# Patient Record
Sex: Female | Born: 1940 | Race: White | Hispanic: No | Marital: Married | Smoking: Never smoker
Health system: Southern US, Community
[De-identification: ages and names within clinical notes are randomized; demographics above are authoritative.]

## PROBLEM LIST (undated history)

## (undated) DIAGNOSIS — E039 Hypothyroidism, unspecified: Secondary | ICD-10-CM

## (undated) DIAGNOSIS — I10 Essential (primary) hypertension: Secondary | ICD-10-CM

## (undated) DIAGNOSIS — M1990 Unspecified osteoarthritis, unspecified site: Secondary | ICD-10-CM

## (undated) HISTORY — PX: COLONOSCOPY: SHX174

## (undated) HISTORY — PX: ANTERIOR AND POSTERIOR VAGINAL REPAIR: SUR5

## (undated) HISTORY — PX: APPENDECTOMY: SHX54

## (undated) HISTORY — PX: ROTATOR CUFF REPAIR: SHX139

## (undated) HISTORY — PX: ABDOMINAL HYSTERECTOMY: SHX81

---

## 2004-12-17 ENCOUNTER — Ambulatory Visit: Payer: Self-pay | Admitting: Internal Medicine

## 2006-03-21 ENCOUNTER — Ambulatory Visit: Payer: Self-pay | Admitting: Internal Medicine

## 2006-05-12 ENCOUNTER — Ambulatory Visit: Payer: Self-pay | Admitting: Unknown Physician Specialty

## 2006-08-03 ENCOUNTER — Ambulatory Visit: Payer: Self-pay | Admitting: Internal Medicine

## 2007-03-27 ENCOUNTER — Ambulatory Visit: Payer: Self-pay | Admitting: Internal Medicine

## 2008-04-17 ENCOUNTER — Ambulatory Visit: Payer: Self-pay | Admitting: Internal Medicine

## 2008-05-22 ENCOUNTER — Ambulatory Visit: Payer: Self-pay | Admitting: Obstetrics and Gynecology

## 2008-05-22 ENCOUNTER — Ambulatory Visit: Payer: Self-pay | Admitting: Cardiovascular Disease

## 2008-05-22 ENCOUNTER — Other Ambulatory Visit: Payer: Self-pay

## 2008-06-09 ENCOUNTER — Ambulatory Visit: Payer: Self-pay | Admitting: Obstetrics and Gynecology

## 2009-04-22 ENCOUNTER — Ambulatory Visit: Payer: Self-pay | Admitting: Internal Medicine

## 2010-04-28 ENCOUNTER — Ambulatory Visit: Payer: Self-pay | Admitting: Internal Medicine

## 2011-06-14 ENCOUNTER — Ambulatory Visit: Payer: Self-pay | Admitting: Internal Medicine

## 2011-06-15 ENCOUNTER — Ambulatory Visit: Payer: Self-pay | Admitting: Internal Medicine

## 2012-06-22 ENCOUNTER — Ambulatory Visit: Payer: Self-pay | Admitting: Internal Medicine

## 2013-06-25 ENCOUNTER — Ambulatory Visit: Payer: Self-pay | Admitting: Internal Medicine

## 2014-05-04 DIAGNOSIS — E039 Hypothyroidism, unspecified: Secondary | ICD-10-CM | POA: Insufficient documentation

## 2014-05-04 DIAGNOSIS — I129 Hypertensive chronic kidney disease with stage 1 through stage 4 chronic kidney disease, or unspecified chronic kidney disease: Secondary | ICD-10-CM | POA: Insufficient documentation

## 2014-05-04 DIAGNOSIS — E785 Hyperlipidemia, unspecified: Secondary | ICD-10-CM | POA: Insufficient documentation

## 2014-07-08 ENCOUNTER — Ambulatory Visit: Payer: Self-pay | Admitting: Internal Medicine

## 2015-05-22 DIAGNOSIS — Z0000 Encounter for general adult medical examination without abnormal findings: Secondary | ICD-10-CM | POA: Insufficient documentation

## 2016-10-10 ENCOUNTER — Other Ambulatory Visit: Payer: Self-pay | Admitting: Internal Medicine

## 2016-10-10 DIAGNOSIS — Z1231 Encounter for screening mammogram for malignant neoplasm of breast: Secondary | ICD-10-CM

## 2016-10-12 ENCOUNTER — Encounter: Payer: Self-pay | Admitting: Radiology

## 2016-10-12 ENCOUNTER — Ambulatory Visit
Admission: RE | Admit: 2016-10-12 | Discharge: 2016-10-12 | Disposition: A | Discharge status: Home / Self Care | Payer: Medicare Other | Source: Ambulatory Visit | Attending: Internal Medicine | Admitting: Internal Medicine

## 2016-10-12 DIAGNOSIS — Z1231 Encounter for screening mammogram for malignant neoplasm of breast: Secondary | ICD-10-CM | POA: Diagnosis present

## 2017-03-02 ENCOUNTER — Other Ambulatory Visit: Payer: Self-pay | Admitting: Orthopedic Surgery

## 2017-03-02 DIAGNOSIS — M1711 Unilateral primary osteoarthritis, right knee: Secondary | ICD-10-CM

## 2017-03-09 ENCOUNTER — Ambulatory Visit
Admission: RE | Admit: 2017-03-09 | Discharge: 2017-03-09 | Disposition: A | Discharge status: Home / Self Care | Payer: Medicare Other | Source: Ambulatory Visit | Attending: Orthopedic Surgery | Admitting: Orthopedic Surgery

## 2017-03-09 DIAGNOSIS — M1711 Unilateral primary osteoarthritis, right knee: Secondary | ICD-10-CM | POA: Diagnosis not present

## 2017-03-22 ENCOUNTER — Encounter
Admission: RE | Admit: 2017-03-22 | Discharge: 2017-03-22 | Disposition: A | Discharge status: Home / Self Care | Payer: Medicare Other | Source: Ambulatory Visit | Attending: Orthopedic Surgery | Admitting: Orthopedic Surgery

## 2017-03-22 ENCOUNTER — Other Ambulatory Visit: Payer: Medicare Other

## 2017-03-22 DIAGNOSIS — Z0183 Encounter for blood typing: Secondary | ICD-10-CM | POA: Insufficient documentation

## 2017-03-22 DIAGNOSIS — Z01812 Encounter for preprocedural laboratory examination: Secondary | ICD-10-CM | POA: Diagnosis not present

## 2017-03-22 DIAGNOSIS — R9431 Abnormal electrocardiogram [ECG] [EKG]: Secondary | ICD-10-CM | POA: Diagnosis not present

## 2017-03-22 DIAGNOSIS — M1711 Unilateral primary osteoarthritis, right knee: Secondary | ICD-10-CM | POA: Insufficient documentation

## 2017-03-22 DIAGNOSIS — Z01818 Encounter for other preprocedural examination: Secondary | ICD-10-CM | POA: Diagnosis not present

## 2017-03-22 HISTORY — DX: Essential (primary) hypertension: I10

## 2017-03-22 HISTORY — DX: Unspecified osteoarthritis, unspecified site: M19.90

## 2017-03-22 HISTORY — DX: Hypothyroidism, unspecified: E03.9

## 2017-03-22 LAB — CBC
HEMATOCRIT: 38.5 % (ref 35.0–47.0)
HEMOGLOBIN: 13.1 g/dL (ref 12.0–16.0)
MCH: 29.6 pg (ref 26.0–34.0)
MCHC: 34.1 g/dL (ref 32.0–36.0)
MCV: 86.7 fL (ref 80.0–100.0)
Platelets: 265 10*3/uL (ref 150–440)
RBC: 4.44 MIL/uL (ref 3.80–5.20)
RDW: 13.4 % (ref 11.5–14.5)
WBC: 5.7 10*3/uL (ref 3.6–11.0)

## 2017-03-22 LAB — TYPE AND SCREEN
ABO/RH(D): A POS
Antibody Screen: NEGATIVE

## 2017-03-22 LAB — BASIC METABOLIC PANEL
ANION GAP: 9 (ref 5–15)
BUN: 25 mg/dL — ABNORMAL HIGH (ref 6–20)
CHLORIDE: 102 mmol/L (ref 101–111)
CO2: 31 mmol/L (ref 22–32)
Calcium: 9.3 mg/dL (ref 8.9–10.3)
Creatinine, Ser: 1.06 mg/dL — ABNORMAL HIGH (ref 0.44–1.00)
GFR calc non Af Amer: 50 mL/min — ABNORMAL LOW (ref >60)
GFR, EST AFRICAN AMERICAN: 58 mL/min — AB (ref >60)
Glucose, Bld: 98 mg/dL (ref 65–99)
POTASSIUM: 3.1 mmol/L — AB (ref 3.5–5.1)
Sodium: 142 mmol/L (ref 135–145)

## 2017-03-22 LAB — PROTIME-INR
INR: 0.94
Prothrombin Time: 12.6 seconds (ref 11.4–15.2)

## 2017-03-22 LAB — SEDIMENTATION RATE: Sed Rate: 21 mm/hr (ref 0–30)

## 2017-03-22 LAB — APTT: APTT: 29 s (ref 24–36)

## 2017-03-22 NOTE — Patient Instructions (Signed)
Your procedure is scheduled on: Tuesday 04/04/17 Report to DAY SURGERY. 2ND FLOOR MEDICAL MALL ENTRANCE. To find out your arrival time please call [phone] between 1PM - 3PM on Monday 04/06/17.  Remember: Instructions that are not followed completely may result in serious medical risk, up to and including death, or upon the discretion of your surgeon and anesthesiologist your surgery may need to be rescheduled.    __X__ 1. Do not eat food or drink liquids after midnight. No gum chewing or hard candies.     __X__ 2. No Alcohol for 24 hours before or after surgery.   ____ 3. Bring all medications with you on the day of surgery if instructed.    __X__ 4. Notify your doctor if there is any change in your medical condition     (cold, fever, infections).             __X___5. No smoking within 24 hours of your surgery.     Do not wear jewelry, make-up, hairpins, clips or nail polish.  Do not wear lotions, powders, or perfumes.   Do not shave 48 hours prior to surgery. Men may shave face and neck.  Do not bring valuables to the hospital.    [facility] is not responsible for any belongings or valuables.               Contacts, dentures or bridgework may not be worn into surgery.  Leave your suitcase in the car. After surgery it may be brought to your room.  For patients admitted to the hospital, discharge time is determined by your                treatment team.   Patients discharged the day of surgery will not be allowed to drive home.   Please read over the following fact sheets that you were given:   MRSA Information   ____ Take these medicines the morning of surgery with A SIP OF WATER:    1. NONE  2.   3.   4.  5.  6.  ____ Fleet Enema (as directed)   __X__ Use CHG Soap as directed  ____ Use inhalers on the day of surgery  ____ Stop metformin 2 days prior to surgery    ____ Take 1/2 of usual insulin dose the night before surgery and none on the morning of surgery.    __X__ Stop Coumadin/Plavix/aspirin on ALREADY STOPPED  __X__ Stop Anti-inflammatories such as Advil, Aleve, Ibuprofen, Motrin, Naproxen, Naprosyn, Goodies,powder, or aspirin products.  OK to take Tylenol.   __X__ Stop supplements until after surgery.  (FISH OIL , CO Q10)  ____ Bring C-Pap to the hospital.

## 2017-03-23 ENCOUNTER — Encounter
Admission: RE | Admit: 2017-03-23 | Discharge: 2017-03-23 | Disposition: A | Discharge status: Home / Self Care | Payer: Medicare Other | Source: Ambulatory Visit | Attending: Orthopedic Surgery | Admitting: Orthopedic Surgery

## 2017-03-23 DIAGNOSIS — Z01818 Encounter for other preprocedural examination: Secondary | ICD-10-CM | POA: Diagnosis not present

## 2017-03-23 LAB — URINALYSIS, ROUTINE W REFLEX MICROSCOPIC
BILIRUBIN URINE: NEGATIVE
Bacteria, UA: NONE SEEN
GLUCOSE, UA: NEGATIVE mg/dL
Ketones, ur: NEGATIVE mg/dL
Nitrite: NEGATIVE
PH: 6 (ref 5.0–8.0)
Protein, ur: NEGATIVE mg/dL
SPECIFIC GRAVITY, URINE: 1.01 (ref 1.005–1.030)

## 2017-03-23 LAB — SURGICAL PCR SCREEN
MRSA, PCR: NEGATIVE
STAPHYLOCOCCUS AUREUS: NEGATIVE

## 2017-03-23 NOTE — Pre-Procedure Instructions (Signed)
[facility] NEEDS KT SUPP STARTED

## 2017-03-24 LAB — URINE CULTURE

## 2017-04-03 MED ORDER — TRANEXAMIC ACID 1000 MG/10ML IV SOLN
1000.0000 mg | INTRAVENOUS | Status: AC
  Administered 2017-04-04: 1000 mg via INTRAVENOUS
  Filled 2017-04-03: qty 10

## 2017-04-03 MED ORDER — CEFAZOLIN SODIUM-DEXTROSE 2-4 GM/100ML-% IV SOLN
2.0000 g | Freq: Once | INTRAVENOUS | Status: AC
  Administered 2017-04-04: 2 g via INTRAVENOUS

## 2017-04-04 ENCOUNTER — Encounter
Admission: RE | Disposition: A | Discharge status: Skilled Nursing Facility | Payer: Self-pay | Source: Ambulatory Visit | Attending: Orthopedic Surgery

## 2017-04-04 ENCOUNTER — Inpatient Hospital Stay: Payer: Medicare Other | Admitting: Registered Nurse

## 2017-04-04 ENCOUNTER — Inpatient Hospital Stay
Admission: RE | Admit: 2017-04-04 | Discharge: 2017-04-07 | DRG: 470 | Disposition: A | Discharge status: Skilled Nursing Facility | Payer: Medicare Other | Source: Ambulatory Visit | Attending: Orthopedic Surgery | Admitting: Orthopedic Surgery

## 2017-04-04 ENCOUNTER — Inpatient Hospital Stay: Payer: Medicare Other

## 2017-04-04 ENCOUNTER — Encounter: Payer: Self-pay | Admitting: *Deleted

## 2017-04-04 DIAGNOSIS — G8918 Other acute postprocedural pain: Secondary | ICD-10-CM

## 2017-04-04 DIAGNOSIS — E876 Hypokalemia: Secondary | ICD-10-CM | POA: Diagnosis not present

## 2017-04-04 DIAGNOSIS — I10 Essential (primary) hypertension: Secondary | ICD-10-CM | POA: Diagnosis present

## 2017-04-04 DIAGNOSIS — M25561 Pain in right knee: Secondary | ICD-10-CM | POA: Diagnosis present

## 2017-04-04 DIAGNOSIS — M1711 Unilateral primary osteoarthritis, right knee: Secondary | ICD-10-CM | POA: Diagnosis present

## 2017-04-04 DIAGNOSIS — E039 Hypothyroidism, unspecified: Secondary | ICD-10-CM | POA: Diagnosis present

## 2017-04-04 DIAGNOSIS — Z79899 Other long term (current) drug therapy: Secondary | ICD-10-CM

## 2017-04-04 HISTORY — PX: TOTAL KNEE ARTHROPLASTY: SHX125

## 2017-04-04 LAB — CBC
HCT: 40.6 % (ref 35.0–47.0)
HEMOGLOBIN: 13.5 g/dL (ref 12.0–16.0)
MCH: 29.4 pg (ref 26.0–34.0)
MCHC: 33.3 g/dL (ref 32.0–36.0)
MCV: 88.2 fL (ref 80.0–100.0)
PLATELETS: 239 10*3/uL (ref 150–440)
RBC: 4.6 MIL/uL (ref 3.80–5.20)
RDW: 14 % (ref 11.5–14.5)
WBC: 5.5 10*3/uL (ref 3.6–11.0)

## 2017-04-04 LAB — CREATININE, SERUM
CREATININE: 1.06 mg/dL — AB (ref 0.44–1.00)
GFR calc Af Amer: 57 mL/min — ABNORMAL LOW (ref >60)
GFR calc non Af Amer: 49 mL/min — ABNORMAL LOW (ref >60)

## 2017-04-04 LAB — POCT I-STAT 4, (NA,K, GLUC, HGB,HCT)
Glucose, Bld: 87 mg/dL (ref 65–99)
HEMATOCRIT: 37 % (ref 36.0–46.0)
HEMOGLOBIN: 12.6 g/dL (ref 12.0–15.0)
Potassium: 3.7 mmol/L (ref 3.5–5.1)
Sodium: 142 mmol/L (ref 135–145)

## 2017-04-04 LAB — ABO/RH: ABO/RH(D): A POS

## 2017-04-04 SURGERY — ARTHROPLASTY, KNEE, TOTAL
Anesthesia: Spinal | Site: Knee | Laterality: Right | Wound class: Clean

## 2017-04-04 MED ORDER — GLYCOPYRROLATE 0.2 MG/ML IJ SOLN
INTRAMUSCULAR | Status: DC | PRN
  Administered 2017-04-04: 0.2 mg via INTRAVENOUS

## 2017-04-04 MED ORDER — BUPIVACAINE LIPOSOME 1.3 % IJ SUSP
INTRAMUSCULAR | Status: AC
  Filled 2017-04-04: qty 20

## 2017-04-04 MED ORDER — CEFAZOLIN SODIUM-DEXTROSE 2-4 GM/100ML-% IV SOLN
2.0000 g | Freq: Four times a day (QID) | INTRAVENOUS | Status: AC
  Administered 2017-04-04 – 2017-04-05 (×2): 2 g via INTRAVENOUS
  Filled 2017-04-04 (×3): qty 100

## 2017-04-04 MED ORDER — PROPOFOL 10 MG/ML IV BOLUS
INTRAVENOUS | Status: AC
  Filled 2017-04-04: qty 20

## 2017-04-04 MED ORDER — MIDAZOLAM HCL 2 MG/2ML IJ SOLN
INTRAMUSCULAR | Status: AC
  Filled 2017-04-04: qty 2

## 2017-04-04 MED ORDER — ONDANSETRON HCL 4 MG PO TABS
4.0000 mg | ORAL_TABLET | Freq: Four times a day (QID) | ORAL | Status: DC | PRN
  Administered 2017-04-05: 4 mg via ORAL
  Filled 2017-04-04: qty 1

## 2017-04-04 MED ORDER — NEOMYCIN-POLYMYXIN B GU 40-200000 IR SOLN
Status: AC
  Filled 2017-04-04: qty 20

## 2017-04-04 MED ORDER — PROPOFOL 500 MG/50ML IV EMUL
INTRAVENOUS | Status: AC
  Filled 2017-04-04: qty 50

## 2017-04-04 MED ORDER — KETAMINE HCL 50 MG/ML IJ SOLN
INTRAMUSCULAR | Status: AC
  Filled 2017-04-04: qty 10

## 2017-04-04 MED ORDER — FENTANYL CITRATE (PF) 100 MCG/2ML IJ SOLN
INTRAMUSCULAR | Status: DC | PRN
  Administered 2017-04-04: 50 ug via INTRAVENOUS
  Administered 2017-04-04 (×2): 25 ug via INTRAVENOUS

## 2017-04-04 MED ORDER — CEFAZOLIN SODIUM-DEXTROSE 2-4 GM/100ML-% IV SOLN
INTRAVENOUS | Status: AC
  Filled 2017-04-04: qty 100

## 2017-04-04 MED ORDER — ACETAMINOPHEN 650 MG RE SUPP: 650.0000 mg | Freq: Four times a day (QID) | RECTAL | Status: DC | PRN

## 2017-04-04 MED ORDER — ACETAMINOPHEN 10 MG/ML IV SOLN
INTRAVENOUS | Status: DC | PRN
  Administered 2017-04-04: 1000 mg via INTRAVENOUS

## 2017-04-04 MED ORDER — SODIUM CHLORIDE 0.9 % IV SOLN: INTRAVENOUS | Status: DC | PRN

## 2017-04-04 MED ORDER — ACETAMINOPHEN 325 MG PO TABS
650.0000 mg | ORAL_TABLET | Freq: Four times a day (QID) | ORAL | Status: DC | PRN
  Administered 2017-04-05: 650 mg via ORAL
  Filled 2017-04-04: qty 2

## 2017-04-04 MED ORDER — ONDANSETRON HCL 4 MG/2ML IJ SOLN: 4.0000 mg | Freq: Once | INTRAMUSCULAR | Status: DC | PRN

## 2017-04-04 MED ORDER — METOCLOPRAMIDE HCL 5 MG/ML IJ SOLN: 5.0000 mg | Freq: Three times a day (TID) | INTRAMUSCULAR | Status: DC | PRN

## 2017-04-04 MED ORDER — METHOCARBAMOL 1000 MG/10ML IJ SOLN
500.0000 mg | Freq: Four times a day (QID) | INTRAVENOUS | Status: DC | PRN
  Filled 2017-04-04: qty 5

## 2017-04-04 MED ORDER — FENTANYL CITRATE (PF) 100 MCG/2ML IJ SOLN
INTRAMUSCULAR | Status: AC
  Filled 2017-04-04: qty 2

## 2017-04-04 MED ORDER — VITAMIN D 1000 UNITS PO TABS
5000.0000 [IU] | ORAL_TABLET | ORAL | Status: DC
  Administered 2017-04-05 – 2017-04-07 (×2): 5000 [IU] via ORAL
  Filled 2017-04-04 (×2): qty 5

## 2017-04-04 MED ORDER — MAGNESIUM OXIDE 400 (241.3 MG) MG PO TABS
200.0000 mg | ORAL_TABLET | Freq: Every evening | ORAL | Status: DC
  Administered 2017-04-04 – 2017-04-06 (×3): 200 mg via ORAL
  Filled 2017-04-04 (×3): qty 1

## 2017-04-04 MED ORDER — OMEGA-3-ACID ETHYL ESTERS 1 G PO CAPS
1000.0000 mg | ORAL_CAPSULE | Freq: Every day | ORAL | Status: DC
  Administered 2017-04-05 – 2017-04-07 (×3): 1000 mg via ORAL
  Filled 2017-04-04 (×3): qty 1

## 2017-04-04 MED ORDER — EPINEPHRINE PF 1 MG/ML IJ SOLN
INTRAMUSCULAR | Status: AC
  Filled 2017-04-04: qty 1

## 2017-04-04 MED ORDER — BISACODYL 10 MG RE SUPP
10.0000 mg | Freq: Every day | RECTAL | Status: DC | PRN
  Administered 2017-04-07: 10 mg via RECTAL
  Filled 2017-04-04: qty 1

## 2017-04-04 MED ORDER — ONDANSETRON HCL 4 MG/2ML IJ SOLN
INTRAMUSCULAR | Status: AC
  Filled 2017-04-04: qty 2

## 2017-04-04 MED ORDER — COQ10 100 MG PO CAPS: 1.0000 | ORAL_CAPSULE | Freq: Two times a day (BID) | ORAL | Status: DC

## 2017-04-04 MED ORDER — FAMOTIDINE 20 MG PO TABS
20.0000 mg | ORAL_TABLET | Freq: Once | ORAL | Status: AC
  Administered 2017-04-04: 20 mg via ORAL

## 2017-04-04 MED ORDER — LIDOCAINE HCL (PF) 2 % IJ SOLN
INTRAMUSCULAR | Status: AC
  Filled 2017-04-04: qty 2

## 2017-04-04 MED ORDER — PROMETHAZINE HCL 25 MG/ML IJ SOLN
12.5000 mg | Freq: Once | INTRAMUSCULAR | Status: AC
  Administered 2017-04-04: 12.5 mg via INTRAVENOUS
  Filled 2017-04-04: qty 1

## 2017-04-04 MED ORDER — SODIUM CHLORIDE 0.9 % IJ SOLN
INTRAMUSCULAR | Status: AC
  Filled 2017-04-04: qty 100

## 2017-04-04 MED ORDER — KETAMINE HCL 10 MG/ML IJ SOLN
INTRAMUSCULAR | Status: DC | PRN
Start: 2017-04-04 — End: 2017-04-04
  Administered 2017-04-04: 25 mg via INTRAVENOUS

## 2017-04-04 MED ORDER — BUPIVACAINE HCL (PF) 0.5 % IJ SOLN
INTRAMUSCULAR | Status: DC | PRN
  Administered 2017-04-04: 3 mL

## 2017-04-04 MED ORDER — MORPHINE SULFATE (PF) 2 MG/ML IV SOLN
2.0000 mg | INTRAVENOUS | Status: DC | PRN
  Administered 2017-04-04: 2 mg via INTRAVENOUS
  Filled 2017-04-04: qty 1

## 2017-04-04 MED ORDER — DIPHENHYDRAMINE HCL 12.5 MG/5ML PO ELIX: 12.5000 mg | ORAL_SOLUTION | ORAL | Status: DC | PRN

## 2017-04-04 MED ORDER — MORPHINE SULFATE 10 MG/ML IJ SOLN
INTRAMUSCULAR | Status: DC | PRN
  Administered 2017-04-04: 10 mg

## 2017-04-04 MED ORDER — SODIUM CHLORIDE 0.9 % IV SOLN
INTRAVENOUS | Status: DC | PRN
  Administered 2017-04-04: 60 mL

## 2017-04-04 MED ORDER — ACETAMINOPHEN 10 MG/ML IV SOLN
INTRAVENOUS | Status: AC
  Filled 2017-04-04: qty 100

## 2017-04-04 MED ORDER — BUPIVACAINE-EPINEPHRINE (PF) 0.25% -1:200000 IJ SOLN
INTRAMUSCULAR | Status: DC | PRN
  Administered 2017-04-04: 30 mL

## 2017-04-04 MED ORDER — SODIUM CHLORIDE 0.9 % IV SOLN
INTRAVENOUS | Status: DC
  Administered 2017-04-04: 14:00:00 via INTRAVENOUS

## 2017-04-04 MED ORDER — LACTATED RINGERS IV SOLN
INTRAVENOUS | Status: DC
  Administered 2017-04-04: 11:00:00 via INTRAVENOUS

## 2017-04-04 MED ORDER — ASPIRIN EC 81 MG PO TBEC
81.0000 mg | DELAYED_RELEASE_TABLET | Freq: Every evening | ORAL | Status: DC
  Administered 2017-04-04 – 2017-04-06 (×3): 81 mg via ORAL
  Filled 2017-04-04 (×3): qty 1

## 2017-04-04 MED ORDER — ALUM & MAG HYDROXIDE-SIMETH 200-200-20 MG/5ML PO SUSP: 30.0000 mL | ORAL | Status: DC | PRN

## 2017-04-04 MED ORDER — PROPOFOL 500 MG/50ML IV EMUL
INTRAVENOUS | Status: DC | PRN
Start: 2017-04-04 — End: 2017-04-04
  Administered 2017-04-04: 100 ug/kg/min via INTRAVENOUS

## 2017-04-04 MED ORDER — CLONIDINE HCL 0.1 MG PO TABS: 0.2000 mg | ORAL_TABLET | Freq: Two times a day (BID) | ORAL | Status: DC | PRN

## 2017-04-04 MED ORDER — ZOLPIDEM TARTRATE 5 MG PO TABS: 5.0000 mg | ORAL_TABLET | Freq: Every evening | ORAL | Status: DC | PRN

## 2017-04-04 MED ORDER — ENOXAPARIN SODIUM 30 MG/0.3ML ~~LOC~~ SOLN
30.0000 mg | Freq: Two times a day (BID) | SUBCUTANEOUS | Status: DC
  Administered 2017-04-05 – 2017-04-07 (×5): 30 mg via SUBCUTANEOUS
  Filled 2017-04-04 (×5): qty 0.3

## 2017-04-04 MED ORDER — LEVOTHYROXINE SODIUM 100 MCG PO TABS
100.0000 ug | ORAL_TABLET | Freq: Every day | ORAL | Status: DC
  Administered 2017-04-05 – 2017-04-07 (×3): 100 ug via ORAL
  Filled 2017-04-04 (×3): qty 1

## 2017-04-04 MED ORDER — METHOCARBAMOL 500 MG PO TABS
500.0000 mg | ORAL_TABLET | Freq: Four times a day (QID) | ORAL | Status: DC | PRN
  Administered 2017-04-05: 500 mg via ORAL
  Filled 2017-04-04: qty 1

## 2017-04-04 MED ORDER — OXYCODONE HCL 5 MG PO TABS
5.0000 mg | ORAL_TABLET | ORAL | Status: DC | PRN
  Administered 2017-04-04: 5 mg via ORAL
  Administered 2017-04-05: 10 mg via ORAL
  Administered 2017-04-05 – 2017-04-07 (×4): 5 mg via ORAL
  Filled 2017-04-04 (×2): qty 1
  Filled 2017-04-04: qty 2
  Filled 2017-04-04 (×3): qty 1

## 2017-04-04 MED ORDER — MIDAZOLAM HCL 5 MG/5ML IJ SOLN
INTRAMUSCULAR | Status: DC | PRN
  Administered 2017-04-04 (×2): 1 mg via INTRAVENOUS

## 2017-04-04 MED ORDER — HYDROCHLOROTHIAZIDE 12.5 MG PO CAPS
12.5000 mg | ORAL_CAPSULE | Freq: Every day | ORAL | Status: DC
  Administered 2017-04-04 – 2017-04-07 (×4): 12.5 mg via ORAL
  Filled 2017-04-04 (×4): qty 1

## 2017-04-04 MED ORDER — ONDANSETRON HCL 4 MG/2ML IJ SOLN
4.0000 mg | Freq: Four times a day (QID) | INTRAMUSCULAR | Status: DC | PRN
  Administered 2017-04-04 – 2017-04-06 (×3): 4 mg via INTRAVENOUS
  Filled 2017-04-04 (×3): qty 2

## 2017-04-04 MED ORDER — MENTHOL 3 MG MT LOZG
1.0000 | LOZENGE | OROMUCOSAL | Status: DC | PRN
  Filled 2017-04-04: qty 9

## 2017-04-04 MED ORDER — NEOMYCIN-POLYMYXIN B GU 40-200000 IR SOLN
Status: DC | PRN
  Administered 2017-04-04: 16 mL

## 2017-04-04 MED ORDER — METOCLOPRAMIDE HCL 10 MG PO TABS: 5.0000 mg | ORAL_TABLET | Freq: Three times a day (TID) | ORAL | Status: DC | PRN

## 2017-04-04 MED ORDER — DOCUSATE SODIUM 100 MG PO CAPS
100.0000 mg | ORAL_CAPSULE | Freq: Two times a day (BID) | ORAL | Status: DC
  Administered 2017-04-04 – 2017-04-07 (×6): 100 mg via ORAL
  Filled 2017-04-04 (×6): qty 1

## 2017-04-04 MED ORDER — MAGNESIUM CITRATE PO SOLN
1.0000 | Freq: Once | ORAL | Status: DC | PRN
  Filled 2017-04-04: qty 296

## 2017-04-04 MED ORDER — BUPIVACAINE HCL (PF) 0.25 % IJ SOLN
INTRAMUSCULAR | Status: AC
  Filled 2017-04-04: qty 30

## 2017-04-04 MED ORDER — FAMOTIDINE 20 MG PO TABS
ORAL_TABLET | ORAL | Status: AC
  Administered 2017-04-04: 20 mg via ORAL
  Filled 2017-04-04: qty 1

## 2017-04-04 MED ORDER — MORPHINE SULFATE (PF) 10 MG/ML IV SOLN
INTRAVENOUS | Status: AC
  Filled 2017-04-04: qty 1

## 2017-04-04 MED ORDER — FENTANYL CITRATE (PF) 100 MCG/2ML IJ SOLN: 25.0000 ug | INTRAMUSCULAR | Status: DC | PRN

## 2017-04-04 MED ORDER — PHENOL 1.4 % MT LIQD
1.0000 | OROMUCOSAL | Status: DC | PRN
  Filled 2017-04-04: qty 177

## 2017-04-04 MED ORDER — MAGNESIUM HYDROXIDE 400 MG/5ML PO SUSP
30.0000 mL | Freq: Every day | ORAL | Status: DC | PRN
Start: 2017-04-04 — End: 2017-04-07
  Administered 2017-04-05 – 2017-04-06 (×2): 30 mL via ORAL
  Filled 2017-04-04 (×3): qty 30

## 2017-04-04 SURGICAL SUPPLY — 66 items
BANDAGE ACE 6X5 VEL STRL LF (GAUZE/BANDAGES/DRESSINGS) ×3 IMPLANT
BLADE SAW 1 (BLADE) ×3 IMPLANT
BLOCK CUTTING FEMUR 4 RT (MISCELLANEOUS) IMPLANT
BLOCK CUTTING TIBIAL 4 RT (MISCELLANEOUS) IMPLANT
BLOCK CUTTING TIBIAL 4 RT MIS (MISCELLANEOUS) IMPLANT
CANISTER SUCT 1200ML W/VALVE (MISCELLANEOUS) ×3 IMPLANT
CANISTER SUCT 3000ML PPV (MISCELLANEOUS) ×6 IMPLANT
CAPT KNEE TOTAL 3 ×3 IMPLANT
CATH FOL LEG HOLDER (MISCELLANEOUS) ×3 IMPLANT
CATH TRAY METER 16FR LF (MISCELLANEOUS) ×3 IMPLANT
CEMENT HV SMART SET (Cement) ×6 IMPLANT
CHLORAPREP W/TINT 26ML (MISCELLANEOUS) ×6 IMPLANT
COOLER POLAR GLACIER W/PUMP (MISCELLANEOUS) ×3 IMPLANT
CUFF TOURN 24 STER (MISCELLANEOUS) IMPLANT
CUFF TOURN 30 STER DUAL PORT (MISCELLANEOUS) ×3 IMPLANT
DRAPE SHEET LG 3/4 BI-LAMINATE (DRAPES) ×6 IMPLANT
ELECT CAUTERY BLADE 6.4 (BLADE) ×3 IMPLANT
ELECT REM PT RETURN 9FT ADLT (ELECTROSURGICAL) ×3
ELECTRODE REM PT RTRN 9FT ADLT (ELECTROSURGICAL) ×1 IMPLANT
FEMUR BONE MODEL IMPLANT
GAUZE PETRO XEROFOAM 1X8 (MISCELLANEOUS) ×3 IMPLANT
GAUZE SPONGE 4X4 12PLY STRL (GAUZE/BANDAGES/DRESSINGS) ×3 IMPLANT
GLOVE BIOGEL PI IND STRL 7.0 (GLOVE) ×6 IMPLANT
GLOVE BIOGEL PI IND STRL 9 (GLOVE) ×1 IMPLANT
GLOVE BIOGEL PI INDICATOR 7.0 (GLOVE) ×12
GLOVE BIOGEL PI INDICATOR 9 (GLOVE) ×2
GLOVE INDICATOR 8.0 STRL GRN (GLOVE) ×6 IMPLANT
GLOVE SURG ORTHO 8.0 STRL STRW (GLOVE) ×6 IMPLANT
GLOVE SURG SYN 9.0  PF PI (GLOVE) ×4
GLOVE SURG SYN 9.0 PF PI (GLOVE) ×2 IMPLANT
GOWN SRG 2XL LVL 4 RGLN SLV (GOWNS) ×1 IMPLANT
GOWN STRL NON-REIN 2XL LVL4 (GOWNS) ×2
GOWN STRL REUS W/ TWL LRG LVL3 (GOWN DISPOSABLE) ×2 IMPLANT
GOWN STRL REUS W/ TWL XL LVL3 (GOWN DISPOSABLE) ×1 IMPLANT
GOWN STRL REUS W/TWL LRG LVL3 (GOWN DISPOSABLE) ×4
GOWN STRL REUS W/TWL XL LVL3 (GOWN DISPOSABLE) ×2
HOOD PEEL AWAY FLYTE STAYCOOL (MISCELLANEOUS) ×6 IMPLANT
IMMBOLIZER KNEE 19 BLUE UNIV (SOFTGOODS) ×3 IMPLANT
KIT RM TURNOVER STRD PROC AR (KITS) ×3 IMPLANT
KNEE MEDACTA TIBIAL/FEMORAL BL (Knees) ×3 IMPLANT
KNIFE SCULPS 14X20 (INSTRUMENTS) ×3 IMPLANT
NDL SAFETY 18GX1.5 (NEEDLE) ×3 IMPLANT
NEEDLE FILTER BLUNT 18X 1/2SAF (NEEDLE) ×2
NEEDLE FILTER BLUNT 18X1 1/2 (NEEDLE) ×1 IMPLANT
NEEDLE SPNL 18GX3.5 QUINCKE PK (NEEDLE) ×3 IMPLANT
NEEDLE SPNL 20GX3.5 QUINCKE YW (NEEDLE) ×3 IMPLANT
NS IRRIG 1000ML POUR BTL (IV SOLUTION) ×3 IMPLANT
PACK TOTAL KNEE (MISCELLANEOUS) ×3 IMPLANT
PAD WRAPON POLAR KNEE (MISCELLANEOUS) ×1 IMPLANT
PULSAVAC PLUS IRRIG FAN TIP (DISPOSABLE) ×3
SOL .9 NS 3000ML IRR  AL (IV SOLUTION) ×2
SOL .9 NS 3000ML IRR UROMATIC (IV SOLUTION) ×1 IMPLANT
STAPLER SKIN PROX 35W (STAPLE) ×3 IMPLANT
SUCTION FRAZIER HANDLE 10FR (MISCELLANEOUS) ×2
SUCTION TUBE FRAZIER 10FR DISP (MISCELLANEOUS) ×1 IMPLANT
SUT DVC 2 QUILL PDO  T11 36X36 (SUTURE) ×2
SUT DVC 2 QUILL PDO T11 36X36 (SUTURE) ×1 IMPLANT
SUT V-LOC 90 ABS DVC 3-0 CL (SUTURE) ×3 IMPLANT
SYR 20CC LL (SYRINGE) ×3 IMPLANT
SYR 3ML 18GX1 1/2 (SYRINGE) ×3 IMPLANT
SYR 50ML LL SCALE MARK (SYRINGE) ×6 IMPLANT
SYR TB 1ML 27GX1/2 LL (SYRINGE) ×3 IMPLANT
TIP FAN IRRIG PULSAVAC PLUS (DISPOSABLE) ×1 IMPLANT
TOWEL OR 17X26 4PK STRL BLUE (TOWEL DISPOSABLE) ×3 IMPLANT
TOWER CARTRIDGE SMART MIX (DISPOSABLE) ×3 IMPLANT
WRAPON POLAR PAD KNEE (MISCELLANEOUS) ×3

## 2017-04-04 NOTE — H&P (Signed)
Patient nausea unrelieved by zofran orders received

## 2017-04-04 NOTE — Anesthesia Post-op Follow-up Note (Signed)
Anesthesia QCDR form completed.        

## 2017-04-04 NOTE — Anesthesia Procedure Notes (Signed)
Spinal  Patient location during procedure: OR Staffing Anesthesiologist: Molli Barrows Resident/CRNA: Rolla Plate Performed: resident/CRNA  Preanesthetic Checklist Completed: patient identified, site marked, surgical consent, pre-op evaluation, timeout performed, IV checked, risks and benefits discussed and monitors and equipment checked Spinal Block Patient position: sitting Prep: ChloraPrep and site prepped and draped Patient monitoring: heart rate, continuous pulse ox, blood pressure and cardiac monitor Approach: midline Location: L4-5 Injection technique: single-shot Needle Needle type: Introducer and Pencan  Needle gauge: 24 G Needle length: 9 cm Additional Notes Negative paresthesia. Negative blood return. Positive free-flowing CSF. Expiration date of kit checked and confirmed. Patient tolerated procedure well, without complications.

## 2017-04-04 NOTE — NC FL2 (Signed)
[place] MEDICAID FL2 LEVEL OF CARE SCREENING TOOL     IDENTIFICATION  Patient Name: Julia Bennett Birthdate: 10/02/1940 Sex: female Admission Date (Current Location): 04/04/2017  [place] and [place] Number:  Chiropodist and Address:  [facility], [address], [place], [place] [address]      Provider Number: [id number]  Attending Physician Name and Address:  Kennedy Bucker, MD  Relative Name and Phone Number:       Current Level of Care: Hospital Recommended Level of Care: Skilled Nursing Facility Prior Approval Number:    Date Approved/Denied:   PASRR Number:  [id number])  Discharge Plan: SNF    Current Diagnoses: Patient Active Problem List   Diagnosis Date Noted  . Primary localized osteoarthritis of right knee 04/04/2017    Orientation RESPIRATION BLADDER Height & Weight     Self, Time, Situation, Place  Normal Continent Weight: 160 lb (72.6 kg) Height:  5\' 6"  (167.6 cm)  BEHAVIORAL SYMPTOMS/MOOD NEUROLOGICAL BOWEL NUTRITION STATUS   (none)  (none) Continent Diet (Regular Diet )  AMBULATORY STATUS COMMUNICATION OF NEEDS Skin   Extensive Assist Verbally Surgical wounds (Incision: Right Knee )                       Personal Care Assistance Level of Assistance  Bathing, Feeding, Dressing Bathing Assistance: Limited assistance Feeding assistance: Independent Dressing Assistance: Limited assistance     Functional Limitations Info  Sight, Hearing, Speech Sight Info: Adequate Hearing Info: Adequate Speech Info: Adequate    SPECIAL CARE FACTORS FREQUENCY  PT (By licensed PT), OT (By licensed OT)     PT Frequency:  (5) OT Frequency:  (5)            Contractures      Additional Factors Info  Code Status, Allergies Code Status Info:  (Full Code. ) Allergies Info:  (No Known Allergies. )           Current Medications (04/04/2017):  This is the current hospital active medication list Current  Facility-Administered Medications  Medication Dose Route Frequency Provider Last Rate Last Dose  . 0.9 %  sodium chloride infusion   Intravenous Continuous Kennedy Bucker, MD 75 mL/hr at 04/04/17 1428    . acetaminophen (TYLENOL) tablet 650 mg  650 mg Oral Q6H PRN Kennedy Bucker, MD       Or  . acetaminophen (TYLENOL) suppository 650 mg  650 mg Rectal Q6H PRN Kennedy Bucker, MD      . alum & mag hydroxide-simeth (MAALOX/MYLANTA) 200-200-20 MG/5ML suspension 30 mL  30 mL Oral Q4H PRN Kennedy Bucker, MD      . aspirin EC tablet 81 mg  81 mg Oral QPM Kennedy Bucker, MD      . bisacodyl (DULCOLAX) suppository 10 mg  10 mg Rectal Daily PRN Kennedy Bucker, MD      . ceFAZolin (ANCEF) IVPB 2g/100 mL premix  2 g Intravenous Q6H Kennedy Bucker, MD      . Melene Muller ON 04/05/2017] cholecalciferol (VITAMIN D) tablet 5,000 Units  5,000 Units Oral Jannifer Hick, MD      . diphenhydrAMINE (BENADRYL) 12.5 MG/5ML elixir 12.5-25 mg  12.5-25 mg Oral Q4H PRN Kennedy Bucker, MD      . docusate sodium (COLACE) capsule 100 mg  100 mg Oral BID Kennedy Bucker, MD      . Melene Muller ON 04/05/2017] enoxaparin (LOVENOX) injection 30 mg  30 mg Subcutaneous Q12H Kennedy Bucker, MD      .  hydrochlorothiazide (MICROZIDE) capsule 12.5 mg  12.5 mg Oral Daily Kennedy BuckerMenz, Michael, MD      . levothyroxine (SYNTHROID, LEVOTHROID) tablet 100 mcg  100 mcg Oral QAC breakfast Kennedy BuckerMenz, Michael, MD      . magnesium citrate solution 1 Bottle  1 Bottle Oral Once PRN Kennedy BuckerMenz, Michael, MD      . magnesium hydroxide (MILK OF MAGNESIA) suspension 30 mL  30 mL Oral Daily PRN Kennedy BuckerMenz, Michael, MD      . magnesium oxide (MAG-OX) tablet 200 mg  200 mg Oral QPM Kennedy BuckerMenz, Michael, MD      . menthol-cetylpyridinium (CEPACOL) lozenge 3 mg  1 lozenge Oral PRN Kennedy BuckerMenz, Michael, MD       Or  . phenol (CHLORASEPTIC) mouth spray 1 spray  1 spray Mouth/Throat PRN Kennedy BuckerMenz, Michael, MD      . methocarbamol (ROBAXIN) tablet 500 mg  500 mg Oral Q6H PRN Kennedy BuckerMenz, Michael, MD       Or  . methocarbamol  (ROBAXIN) 500 mg in dextrose 5 % 50 mL IVPB  500 mg Intravenous Q6H PRN Kennedy BuckerMenz, Michael, MD      . metoCLOPramide (REGLAN) tablet 5-10 mg  5-10 mg Oral Q8H PRN Kennedy BuckerMenz, Michael, MD       Or  . metoCLOPramide (REGLAN) injection 5-10 mg  5-10 mg Intravenous Q8H PRN Kennedy BuckerMenz, Michael, MD      . morphine 2 MG/ML injection 2 mg  2 mg Intravenous Q2H PRN Kennedy BuckerMenz, Michael, MD      . omega-3 acid ethyl esters (LOVAZA) capsule 1,000 mg  1,000 mg Oral Daily Kennedy BuckerMenz, Michael, MD      . ondansetron [facility](ZOFRAN) tablet 4 mg  4 mg Oral Q6H PRN Kennedy BuckerMenz, Michael, MD       Or  . ondansetron [facility](ZOFRAN) injection 4 mg  4 mg Intravenous Q6H PRN Kennedy BuckerMenz, Michael, MD      . oxyCODONE (Oxy IR/ROXICODONE) immediate release tablet 5-10 mg  5-10 mg Oral Q3H PRN Kennedy BuckerMenz, Michael, MD      . zolpidem (AMBIEN) tablet 5 mg  5 mg Oral QHS PRN Kennedy BuckerMenz, Michael, MD         Discharge Medications: Please see discharge summary for a list of discharge medications.  Relevant Imaging Results:  Relevant Lab Results:   Additional Information  (SSN: [id number])  Unique Sillas, Darleen CrockerBailey M, LCSW

## 2017-04-04 NOTE — Progress Notes (Signed)
Patient a&o, vss. Pain meds given. Dressing dry and intact. Polar care and foot pumps in place, towels under ankles. Family at bedside. No nausea/vomiting. Tolerating liquids. Continue to monitor.

## 2017-04-04 NOTE — Anesthesia Preprocedure Evaluation (Signed)
Anesthesia Evaluation  Patient identified by MRN, date of birth, ID band Patient awake    Reviewed: Allergy & Precautions, H&P , NPO status , Patient's Chart, lab work & pertinent test results, reviewed documented beta blocker date and time   Airway Mallampati: II   Neck ROM: full    Dental  (+) Poor Dentition   Pulmonary neg pulmonary ROS,    Pulmonary exam normal        Cardiovascular hypertension, negative cardio ROS Normal cardiovascular exam Rhythm:regular Rate:Normal     Neuro/Psych negative neurological ROS  negative psych ROS   GI/Hepatic negative GI ROS, Neg liver ROS,   Endo/Other  negative endocrine ROS  Renal/GU negative Renal ROS  negative genitourinary   Musculoskeletal   Abdominal   Peds  Hematology negative hematology ROS (+)   Anesthesia Other Findings Past Medical History: No date: Arthritis No date: Hypertension No date: Hypothyroidism Past Surgical History: No date: ABDOMINAL HYSTERECTOMY No date: APPENDECTOMY No date: COLONOSCOPY No date: ROTATOR CUFF REPAIR; Right BMI    Body Mass Index:  25.82 kg/m     Reproductive/Obstetrics negative OB ROS                             Anesthesia Physical Anesthesia Plan  ASA: III  Anesthesia Plan: General   Post-op Pain Management:    Induction:   PONV Risk Score and Plan: 4 or greater and Ondansetron, Dexamethasone, Midazolam, Scopolamine patch - Pre-op and Propofol infusion  Airway Management Planned:   Additional Equipment:   Intra-op Plan:   Post-operative Plan:   Informed Consent: I have reviewed the patients History and Physical, chart, labs and discussed the procedure including the risks, benefits and alternatives for the proposed anesthesia with the patient or authorized representative who has indicated his/her understanding and acceptance.   Dental Advisory Given  Plan Discussed with:  CRNA  Anesthesia Plan Comments:         Anesthesia Quick Evaluation

## 2017-04-04 NOTE — Progress Notes (Signed)

## 2017-04-04 NOTE — H&P (Signed)
Reviewed paper H+P, will be scanned into chart. No changes noted.  

## 2017-04-04 NOTE — Transfer of Care (Signed)
Immediate Anesthesia Transfer of Care Note  Patient: Julia Bennett  Procedure(s) Performed: Procedure(s): TOTAL KNEE ARTHROPLASTY (Right)  Patient Location: PACU  Anesthesia Type:Spinal  Level of Consciousness: sedated  Airway & Oxygen Therapy: Patient Spontanous Breathing  Post-op Assessment: Report given to RN and Post -op Vital signs reviewed and stable  Post vital signs: Reviewed and stable  Last Vitals:  Vitals:   04/04/17 1027 04/04/17 1345  BP: (!) 158/74 (!) 116/52  Pulse: 66 73  Resp: 18 (!) 7  Temp: (!) 36 C (!) 36.3 C    Last Pain:  Vitals:   04/04/17 1027  TempSrc: Tympanic         Complications: No apparent anesthesia complications

## 2017-04-04 NOTE — Op Note (Signed)
04/04/2017  1:44 PM  PATIENT:  Julia Bennett  76 y.o. female  PRE-OPERATIVE DIAGNOSIS:  PRIMARY OSTEOARTHRITIS OF RIGHT KNEE  POST-OPERATIVE DIAGNOSIS:  PRIMARY OSTEOARTHRITIS OF RIGHT KNEE  PROCEDURE:  Procedure(s): TOTAL KNEE ARTHROPLASTY (Right)  SURGEON: Leitha SchullerMichael J Shanetra Blumenstock, MD  ASSISTANTS: [facility]  ANESTHESIA:   spinal  EBL:  Total I/O In: 800 [I.V.:800] Out: 260 [Urine:160; Blood:100]  BLOOD ADMINISTERED:none  DRAINS: none   LOCAL MEDICATIONS USED:  MARCAINE    and OTHER morphine and Exparel  SPECIMEN:  No Specimen  DISPOSITION OF SPECIMEN:  PATHOLOGY  COUNTS:  YES  TOURNIQUET:  58 min at 300 mm Hg  IMPLANTS: Medacta GMK sphere system right 4 femur, 4 tibia with 10 mm insert to patella and 11 x 30 mm stem on the tibia, all components cemented  DICTATION: .Dragon Dictation  patient brought the operating room and after adequate spinal anesthesia was obtained the right leg was prepped draped in sterile fashion was turned by the upper thigh. After patient identification and timeout procedures were completed, tourniquet was raised and a midline skin incision was made with the knee in flexion followed by medial parapatellar arthrotomy. There is sclerotic bone in the medial compartment with significant wear to the femoral condyle and medial tibial condyle with moderate patellofemoral and lateral compartment changes. Anterior cruciate ligament fat pad were excised. Medacta cutting guide applied after removing cartilage off the anterior aspect of the tibia and proximal tibia cut carried out. Next the distal femoral cut was carried out after applying the my knee cutting guide to it. The 4 cutting block was applied anterior posterior and chamfer cuts made with no notching. Residual PCL removed at this time with excision of the posterior horns of the menisci. The 4 tibia baseplate was placed with appropriate rotation based on the preop templating and pin from the my knee guide  and proximal tibial preparation carried out with drilling and placement of the keel. The 4 femoral trial was placed a 10 mm insert gave excellent stability through range of motion was chosen for the final component. Distal femoral drill holes were made followed by the trochlear groove cut. Trials were removed and the knee held in extension and patella cut using the patellar cutting guide and sized to a size 2 after drilling holes were made. This tourniquetwas let down at this point with the injection given and hemostasis checked with electrocautery. Tourniquet was then raised and the bony surfaces thoroughly irrigated and dried the tibial component was cemented into place first with removal of excess cement followed by placement of the tibial polyethylene component with set screw using torque screwdriver. The distal femoral component was then applied and the knee held in extension with patellar button clamped into place with cement being used. After the cement had set excess cement was removed and the knee thoroughly irrigated with a tourniquet let down. Patella tracked well with no touch technique. The arthrotomy was repaired using a heavy Quill followed by 3-0 v-loc subcutaneously. Skin was closed with staples followed by dressing of Xeroform 4 x 4's web roll Ace wrap and Polar Care  PLAN OF CARE: Admit to inpatient   PATIENT DISPOSITION:  PACU - hemodynamically stable.

## 2017-04-05 LAB — CBC
HEMATOCRIT: 35.3 % (ref 35.0–47.0)
HEMOGLOBIN: 12 g/dL (ref 12.0–16.0)
MCH: 30 pg (ref 26.0–34.0)
MCHC: 33.9 g/dL (ref 32.0–36.0)
MCV: 88.5 fL (ref 80.0–100.0)
Platelets: 194 10*3/uL (ref 150–440)
RBC: 3.99 MIL/uL (ref 3.80–5.20)
RDW: 13.7 % (ref 11.5–14.5)
WBC: 7.1 10*3/uL (ref 3.6–11.0)

## 2017-04-05 LAB — BASIC METABOLIC PANEL
ANION GAP: 7 (ref 5–15)
BUN: 17 mg/dL (ref 6–20)
CALCIUM: 8.5 mg/dL — AB (ref 8.9–10.3)
CHLORIDE: 104 mmol/L (ref 101–111)
CO2: 28 mmol/L (ref 22–32)
Creatinine, Ser: 1 mg/dL (ref 0.44–1.00)
GFR calc non Af Amer: 53 mL/min — ABNORMAL LOW (ref >60)
GLUCOSE: 116 mg/dL — AB (ref 65–99)
Potassium: 3.7 mmol/L (ref 3.5–5.1)
Sodium: 139 mmol/L (ref 135–145)

## 2017-04-05 MED ORDER — TRAMADOL HCL 50 MG PO TABS
50.0000 mg | ORAL_TABLET | Freq: Four times a day (QID) | ORAL | Status: DC | PRN
  Administered 2017-04-05: 50 mg via ORAL
  Administered 2017-04-05 – 2017-04-06 (×2): 100 mg via ORAL
  Filled 2017-04-05 (×2): qty 2
  Filled 2017-04-05: qty 1

## 2017-04-05 NOTE — Clinical Social Work Note (Signed)
Clinical Social Work Assessment  Patient Details  Name: Julia Bennett MRN: [id number] Date of Birth: 09-Aug-1941  Date of referral:  04/05/17               Reason for consult:  Facility Placement                Permission sought to share information with:  Chartered certified accountant granted to share information::  Yes, Verbal Permission Granted  Name::      [name]::   [name]   Relationship::     Contact Information:     Housing/Transportation Living arrangements for the past 2 months:  [name] of Information:  Patient Patient Interpreter Needed:  None Criminal Activity/Legal Involvement Pertinent to Current Situation/Hospitalization:  No - Comment as needed Significant Relationships:  Spouse Lives with:  Spouse Do you feel safe going back to the place where you live?  Yes Need for family participation in patient care:  Yes (Comment)  Care giving concerns:  Patient lives in [place] with her husband [name].    Social Worker assessment / plan:  Holiday representative (CSW) received SNF consult. PT is recommending SNF. CSW met with patient alone at bedside to discuss D/C plan. Patient was alert and oriented X4 and was laying in the bed. CSW introduced self and explained role of CSW department. Patient reported that she lives in the [place] part of [place] near [place] with her husband [name]. CSW explained SNF process. Patient is familiar with process because she came to joint class and had family members in SNFs. Patient requested a private room at [facility]. FL2 complete and faxed out. CSW will continue to follow and assist as needed.   Employment status:  Retired Nurse, adult PT Recommendations:  [place] / Referral to community resources:  [place]  Patient/Family's Response to care:  Patient is agreeable to [employer] in  [place].   Patient/Family's Understanding of and Emotional Response to Diagnosis, Current Treatment, and Prognosis:  Patient was very pleasant and thanked CSW for assistance.   Emotional Assessment Appearance:  Appears stated age Attitude/Demeanor/Rapport:    Affect (typically observed):  Accepting, Adaptable, Pleasant Orientation:  Oriented to Self, Oriented to Place, Oriented to  Time, Oriented to Situation Alcohol / Substance use:  Not Applicable Psych involvement (Current and /or in the community):  No (Comment)  Discharge Needs  Concerns to be addressed:  Discharge Planning Concerns Readmission within the last 30 days:  No Current discharge risk:  Dependent with Mobility Barriers to Discharge:  Continued Medical Work up   [employer], Veronia Beets, LCSW 04/05/2017, 10:58 AM

## 2017-04-05 NOTE — Progress Notes (Signed)
Clinical Child psychotherapistocial Worker (CSW) presented bed offers to patient and her [place]. They chose [facility]. [facility] liaison is aware of above. Patient can D/C to [facility] when medically stable.   [employer] Hughes IncorporatedBailey Edin Skarda, LCSW [phone]

## 2017-04-05 NOTE — Evaluation (Signed)
Physical Therapy Evaluation Patient Details Name: Julia Bennett MRN: [id number] DOB: 05/28/1941 Today's Date: 04/05/2017   History of Present Illness  Pt is a 76 yo F with a diagnosis of primary OA of the R knee and is s/p elective R TKA.  Clinical Impression  Pt presents with deficits in strength, transfers, mobility, gait, balance, R knee ROM, and activity tolerance.  Pt required Min A and extra time and effort with all bed mobility tasks.  Pt required Min A for sit to/from stand transfers from elevated EOB with mod verbal cues for sequencing.  Pt antalgic and unsteady in standing with KI donned to RLE and was only able to take 1-2 very small steps with RW and Min A with mod-max verbal cues for sequencing.  Pt did not feel steady or safe enough to attempt transfer from EOB to recliner.  Pt will benefit from PT services in a SNF setting upon discharge to address above deficits for decreased caregiver assistance and safe return to PLOF.      Follow Up Recommendations SNF    Equipment Recommendations  Rolling walker with 5" wheels (TBD at next venue of care if discharges to SNF)    Recommendations for Other Services       Precautions / Restrictions Precautions Precautions: Fall;Knee Precaution Booklet Issued: Yes (comment) Required Braces or Orthoses: Knee Immobilizer - Right Restrictions Weight Bearing Restrictions: Yes RLE Weight Bearing: Weight bearing as tolerated      Mobility  Bed Mobility Overal bed mobility: Needs Assistance Bed Mobility: Supine to Sit;Sit to Supine     Supine to sit: Min assist Sit to supine: Min assist   General bed mobility comments: Extra time and effort required  Transfers Overall transfer level: Needs assistance Equipment used: Rolling walker (2 wheeled) Transfers: Sit to/from Stand Sit to Stand: Min assist         General transfer comment: Elevated EOB, mod verbal cues for sequencing  Ambulation/Gait Ambulation/Gait assistance: Min  assist Ambulation Distance (Feet): 2 Feet Assistive device: Rolling walker (2 wheeled) Gait Pattern/deviations: Step-to pattern;Antalgic   Gait velocity interpretation: Below normal speed for age/gender General Gait Details: KI donned to RLE with WB activities, pt unable to perform Ind RLE SLR; very antalgic gait on RLE  Stairs            Wheelchair Mobility    Modified Rankin (Stroke Patients Only)       Balance Overall balance assessment: Needs assistance Sitting-balance support: No upper extremity supported;Feet supported Sitting balance-Leahy Scale: Good     Standing balance support: Bilateral upper extremity supported Standing balance-Leahy Scale: Fair                               Pertinent Vitals/Pain Pain Assessment: 0-10 Pain Score: 1  Pain Location: R knee Pain Descriptors / Indicators: Aching;Sore Pain Intervention(s): Premedicated before session;Monitored during session;Limited activity within patient's tolerance    Home Living Family/patient expects to be discharged to:: Private residence Living Arrangements: Spouse/significant other Available Help at Discharge: Family;Available PRN/intermittently (Spouse works FT) Type of Home: House Home Access: Stairs to enter Entrance Stairs-Rails: None Secretary/administrator of Steps: 3 Home Layout: Laundry or work area in basement;Two level;Able to live on main level with bedroom/bathroom Home Equipment: Gilmer Mor - single point      Prior Function Level of Independence: Independent with assistive device(s)         Comments: Mod I with amb  with SPC, Ind with ADLs, no fall history     Hand Dominance   Dominant Hand: Right    Extremity/Trunk Assessment        Lower Extremity Assessment Lower Extremity Assessment: Generalized weakness       Communication   Communication: No difficulties  Cognition Arousal/Alertness: Awake/alert Behavior During Therapy: WFL for tasks  assessed/performed Overall Cognitive Status: Within Functional Limits for tasks assessed                                        General Comments      Exercises Total Joint Exercises Ankle Circles/Pumps: AROM;Both;10 reps;15 reps Quad Sets: Strengthening;Both;10 reps;15 reps Gluteal Sets: Strengthening;Both;10 reps;15 reps Hip ABduction/ADduction: AAROM;Right;10 reps Straight Leg Raises: AAROM;Right;5 reps;10 reps Long Arc Quad: AROM;Right;5 reps;10 reps Knee Flexion: AROM;Right;5 reps;10 reps Goniometric ROM: R knee A/AAROM: flex 60/63 deg, ext -10/-6 deg Marching in Standing: AROM;Both;5 reps Other Exercises Other Exercises: TKA excercise book education/review   Assessment/Plan    PT Assessment Patient needs continued PT services  PT Problem List Decreased strength;Decreased range of motion;Decreased activity tolerance;Decreased balance;Decreased knowledge of use of DME;Decreased mobility       PT Treatment Interventions DME instruction;Gait training;Stair training;Functional mobility training;Neuromuscular re-education;Balance training;Therapeutic exercise;Therapeutic activities;Patient/family education    PT Goals (Current goals can be found in the Care Plan section)  Acute Rehab PT Goals Patient Stated Goal: "To go up steps like a normal person" PT Goal Formulation: With patient Time For Goal Achievement: 04/18/17 Potential to Achieve Goals: Good    Frequency BID   Barriers to discharge Inaccessible home environment;Decreased caregiver support      Co-evaluation               AM-PAC PT "6 Clicks" Daily Activity  Outcome Measure Difficulty turning over in bed (including adjusting bedclothes, sheets and blankets)?: Total Difficulty moving from lying on back to sitting on the side of the bed? : Total Difficulty sitting down on and standing up from a chair with arms (e.g., wheelchair, bedside commode, etc,.)?: Total Help needed moving to and  from a bed to chair (including a wheelchair)?: A Lot Help needed walking in hospital room?: Total Help needed climbing 3-5 steps with a railing? : Total 6 Click Score: 7    End of Session Equipment Utilized During Treatment: Gait belt Activity Tolerance: Patient limited by fatigue;Patient limited by pain Patient left: in bed;with bed alarm set;with SCD's reapplied;with call bell/phone within reach (Polar care donned to RLE, B heels elevated with towel rolls) Nurse Communication: Mobility status PT Visit Diagnosis: Other abnormalities of gait and mobility (R26.89);Muscle weakness (generalized) (M62.81)    Time: [id number] PT Time Calculation (min) (ACUTE ONLY): 49 min   Charges:   PT Evaluation $PT Eval Low Complexity: 1 Low PT Treatments $Therapeutic Exercise: 8-22 mins $Therapeutic Activity: 8-22 mins   PT G Codes:        Elly Modena. Scott Emmerson Taddei PT, DPT 04/05/17, 10:39 AM

## 2017-04-05 NOTE — Clinical Social Work Placement (Signed)
   CLINICAL SOCIAL WORK PLACEMENT  NOTE  Date:  04/05/2017  Patient Details  Name: Julia Bennett MRN: [id number] Date of Birth: 09/30/1940  Clinical Social Work is seeking post-discharge placement for this patient at the Skilled  Nursing Facility level of care (*CSW will initial, date and re-position this form in  chart as items are completed):  Yes   Patient/family provided with [facility] Clinical Social Work Department's list of facilities offering this level of care within the geographic area requested by the patient (or if unable, by the patient's family).  Yes   Patient/family informed of their freedom to choose among providers that offer the needed level of care, that participate in Medicare, Medicaid or managed care program needed by the patient, have an available bed and are willing to accept the patient.  Yes   Patient/family informed of [facility] ownership interest in [facility] and [facility], as well as of the fact that they are under no obligation to receive care at these facilities.  PASRR submitted to EDS on 04/04/17     PASRR number received on 04/04/17     Existing PASRR number confirmed on       FL2 transmitted to all facilities in geographic area requested by pt/family on 04/05/17     FL2 transmitted to all facilities within larger geographic area on       Patient informed that his/her managed care company has contracts with or will negotiate with certain facilities, including the following:            Patient/family informed of bed offers received.  Patient chooses bed at       Physician recommends and patient chooses bed at      Patient to be transferred to   on  .  Patient to be transferred to facility by       Patient family notified on   of transfer.  Name of family member notified:        PHYSICIAN       Additional Comment:    _______________________________________________ Braydee Shimkus, Darleen CrockerBailey M, LCSW 04/05/2017, 10:57 AM

## 2017-04-05 NOTE — Progress Notes (Signed)
   Subjective: 1 Day Post-Op Procedure(s) (LRB): TOTAL KNEE ARTHROPLASTY (Right) Patient reports pain as moderate.   Patient is well, and has had no acute complaints or problems Denies any CP, SOB, ABD pain. We will start therapy today.    Objective: Vital signs in last 24 hours: Temp:  [96.8 F (36 C)-99 F (37.2 C)] 99 F (37.2 C) (08/08 0800) Pulse Rate:  [55-76] 76 (08/08 0800) Resp:  [7-20] 20 (08/08 0800) BP: (116-185)/(52-82) 168/82 (08/08 0804) SpO2:  [94 %-99 %] 98 % (08/08 0800) FiO2 (%):  [21 %] 21 % (08/07 1510) Weight:  [72.6 kg (160 lb)] 72.6 kg (160 lb) (08/07 1027)  Intake/Output from previous day: 08/07 0701 - 08/08 0700 In: 1530 [P.O.:240; I.V.:1190; IV Piggyback:100] Out: 1060 [Urine:960; Blood:100] Intake/Output this shift: No intake/output data recorded.   Recent Labs  04/04/17 1034 04/04/17 1044 04/05/17 0327  HGB 13.5 12.6 12.0    Recent Labs  04/04/17 1034 04/04/17 1044 04/05/17 0327  WBC 5.5  --  7.1  RBC 4.60  --  3.99  HCT 40.6 37.0 35.3  PLT 239  --  194    Recent Labs  04/04/17 1034 04/04/17 1044 04/05/17 0327  NA  --  142 139  K  --  3.7 3.7  CL  --   --  104  CO2  --   --  28  BUN  --   --  17  CREATININE 1.06*  --  1.00  GLUCOSE  --  87 116*  CALCIUM  --   --  8.5*   No results for input(s): LABPT, INR in the last 72 hours.  EXAM General - Patient is Alert, Appropriate and Oriented Extremity - Neurovascular intact Sensation intact distally Intact pulses distally Dorsiflexion/Plantar flexion intact No cellulitis present Compartment soft Dressing - dressing C/D/I and no drainage Motor Function - intact, moving foot and toes well on exam.   Past Medical History:  Diagnosis Date  . Arthritis   . Hypertension   . Hypothyroidism     Assessment/Plan:   1 Day Post-Op Procedure(s) (LRB): TOTAL KNEE ARTHROPLASTY (Right) Active Problems:   Primary localized osteoarthritis of right knee  Estimated body mass  index is 25.82 kg/m as calculated from the following:   Height as of this encounter: 5\' 6"  (1.676 m).   Weight as of this encounter: 72.6 kg (160 lb). Advance diet Up with therapy  Needs BM BP elevated - Asymptomatic. Likely pain related, will add ultram to pain regimen. Continue to monitor vitals Recheck labs in the am CM to assist with discharge   DVT Prophylaxis - Aspirin, Lovenox, Foot Pumps and TED hose Weight-Bearing as tolerated to rigjt leg   T. Cranston Neighborhris Azyria Osmon, PA-C [facility] 04/05/2017, 8:10 AM

## 2017-04-05 NOTE — Anesthesia Postprocedure Evaluation (Signed)
Anesthesia Post Note  Patient: Ernestine McmurrayDorothy S Liska  Procedure(s) Performed: Procedure(s) (LRB): TOTAL KNEE ARTHROPLASTY (Right)  Patient location during evaluation: Nursing Unit Anesthesia Type: Spinal Level of consciousness: awake, awake and alert and oriented Pain management: pain level controlled Vital Signs Assessment: post-procedure vital signs reviewed and stable Respiratory status: spontaneous breathing, nonlabored ventilation and respiratory function stable Cardiovascular status: blood pressure returned to baseline and stable Postop Assessment: no headache and no backache Anesthetic complications: no     Last Vitals:  Vitals:   04/04/17 1640 04/04/17 2305  BP: (!) 178/80 (!) 132/58  Pulse: 61 66  Resp: 18 19  Temp:  36.6 C    Last Pain:  Vitals:   04/05/17 0626  TempSrc:   PainSc: 7                  [employer] CorporationStephanie Ta Fair

## 2017-04-05 NOTE — Evaluation (Signed)
Occupational Therapy Evaluation Patient Details Name: Julia Bennett MRN: [id number] DOB: Jan 10, 1941 Today's Date: 04/05/2017    History of Present Illness Pt. is a 76 y.o. female who was admitted to [facility] for a RTKA secondary to OA of the Knee.   Clinical Impression   Pt. Is an1 y.o. Female who was admitted to [facility] for a RTKR. Pt.'s BP 169/75. Pt. resides at home with her husband, and reports being independent with ADLs, and IADLs. Pt. Presents with weakness, decreased activity tolerance, pain, and limited functional mobility. Pt. Will benefit from skilled OT services for ADL training, A/E training, and pt. Education about DME, and home modification. Pt. Education was provided verbally, and through visual demonstration. Pt. Was able to return demonstration, however required assist. Pt. Reports having two reachers at home with suction cups at the tips. Pt. Would benefit from SNF level of care upon discharge with follow-up OT services.      Follow Up Recommendations  SNF    Equipment Recommendations       Recommendations for Other Services       Precautions / Restrictions Precautions Precautions: Fall;Knee Precaution Booklet Issued: Yes (comment) Required Braces or Orthoses: Knee Immobilizer - Right Restrictions Weight Bearing Restrictions: Yes RLE Weight Bearing: Weight bearing as tolerated             ADL either performed or assessed with clinical judgement   ADL Overall ADL's : Needs assistance/impaired Eating/Feeding: Set up   Grooming: Minimal assistance   Upper Body Bathing: Set up   Lower Body Bathing: Maximal assistance   Upper Body Dressing : Set up   Lower Body Dressing: Maximal assistance               Functional mobility during ADLs: Moderate assistance General ADL Comments: Pt. edcuation was provided about OT services, home routines, A/E use for LE ADLs, and DME.     Vision Baseline Vision/History: No visual deficits Patient Visual Report: No  change from baseline       Perception     Praxis      Pertinent Vitals/Pain Pain Assessment: 0-10 Pain Score: 2  Pain Location: R knee Pain Descriptors / Indicators: Aching Pain Intervention(s): Limited activity within patient's tolerance;Monitored during session     Hand Dominance Right   Extremity/Trunk Assessment Upper Extremity Assessment Upper Extremity Assessment: Overall WFL for tasks assessed   Lower Extremity Assessment Lower Extremity Assessment: Generalized weakness       Communication Communication Communication: No difficulties   Cognition Arousal/Alertness: Awake/alert Behavior During Therapy: WFL for tasks assessed/performed Overall Cognitive Status: Within Functional Limits for tasks assessed                                     General Comments       Exercises   Shoulder Instructions      Home Living Family/patient expects to be discharged to:: Private residence Living Arrangements: Spouse/significant other Available Help at Discharge: Family;Available PRN/intermittently;Available 24 hours/day Type of Home: House Home Access: Stairs to enter [employer] of Steps: 3 Entrance Stairs-Rails: None Home Layout: Laundry or work area in basement;Two level;Able to live on main level with bedroom/bathroom     Bathroom Shower/Tub: Walk-in shower;Tub/shower unit;Door;Curtain (Mostly uses the walk-in shower which has a door.)         Home Equipment: Gilmer Mor - single point          Prior  Functioning/Environment Level of Independence: Independent with assistive device(s)        Comments: Independent with ADLs, IADLs        OT Problem List: Decreased strength;Decreased range of motion;Decreased activity tolerance;Impaired UE functional use;Decreased knowledge of use of DME or AE      OT Treatment/Interventions: Self-care/ADL training;Therapeutic exercise;Patient/family education;Therapeutic activities;DME and/or AE  instruction    OT Goals(Current goals can be found in the care plan section) Acute Rehab OT Goals Patient Stated Goal: To regain independence OT Goal Formulation: With patient Potential to Achieve Goals: Good  OT Frequency: Min 2X/week   Barriers to D/C:            Co-evaluation              AM-PAC PT "6 Clicks" Daily Activity     Outcome Measure Help from another person eating meals?: None Help from another person taking care of personal grooming?: A Little Help from another person toileting, which includes using toliet, bedpan, or urinal?: A Lot Help from another person bathing (including washing, rinsing, drying)?: A Lot Help from another person to put on and taking off regular upper body clothing?: None Help from another person to put on and taking off regular lower body clothing?: A Lot 6 Click Score: 17   End of Session    Activity Tolerance: Patient tolerated treatment well Patient left: in bed;with call bell/phone within reach;with family/visitor present  OT Visit Diagnosis: Muscle weakness (generalized) (M62.81)                Time: 1100-1130 OT Time Calculation (min): 30 min Charges:  OT General Charges $OT Visit: 1 Procedure OT Evaluation $OT Eval Moderate Complexity: 1 Procedure OT Treatments $Self Care/Home Management : 23-37 mins G-Codes: OT G-codes **NOT FOR INPATIENT CLASS** Functional Assessment Tool Used: AM-PAC 6 Clicks Daily Activity;Clinical judgement Functional Limitation: Self care Self Care Current Status ([id number]): At least 60 percent but less than 80 percent impaired, limited or restricted Self Care Goal Status ([id number]): At least 1 percent but less than 20 percent impaired, limited or restricted   Olegario MessierElaine Grantland Want, MS, OTR/L   Olegario MessierElaine Tiffney Haughton, MS, OTR/L 04/05/2017, 11:44 AM

## 2017-04-06 LAB — BASIC METABOLIC PANEL
Anion gap: 8 (ref 5–15)
BUN: 12 mg/dL (ref 6–20)
CALCIUM: 8.7 mg/dL — AB (ref 8.9–10.3)
CO2: 32 mmol/L (ref 22–32)
Chloride: 93 mmol/L — ABNORMAL LOW (ref 101–111)
Creatinine, Ser: 1.02 mg/dL — ABNORMAL HIGH (ref 0.44–1.00)
GFR, EST AFRICAN AMERICAN: 60 mL/min — AB (ref >60)
GFR, EST NON AFRICAN AMERICAN: 52 mL/min — AB (ref >60)
Glucose, Bld: 121 mg/dL — ABNORMAL HIGH (ref 65–99)
Potassium: 2.9 mmol/L — ABNORMAL LOW (ref 3.5–5.1)
SODIUM: 133 mmol/L — AB (ref 135–145)

## 2017-04-06 LAB — CBC
HCT: 34 % — ABNORMAL LOW (ref 35.0–47.0)
HEMOGLOBIN: 11.7 g/dL — AB (ref 12.0–16.0)
MCH: 29.7 pg (ref 26.0–34.0)
MCHC: 34.3 g/dL (ref 32.0–36.0)
MCV: 86.5 fL (ref 80.0–100.0)
PLATELETS: 171 10*3/uL (ref 150–440)
RBC: 3.93 MIL/uL (ref 3.80–5.20)
RDW: 13.5 % (ref 11.5–14.5)
WBC: 10.6 10*3/uL (ref 3.6–11.0)

## 2017-04-06 MED ORDER — TRAMADOL HCL 50 MG PO TABS: 50.0000 mg | ORAL_TABLET | Freq: Four times a day (QID) | ORAL | 0 refills | Status: DC | PRN

## 2017-04-06 MED ORDER — DOCUSATE SODIUM 100 MG PO CAPS: 100.0000 mg | ORAL_CAPSULE | Freq: Two times a day (BID) | ORAL | 0 refills | Status: DC

## 2017-04-06 MED ORDER — BISACODYL 10 MG RE SUPP: 10.0000 mg | Freq: Every day | RECTAL | 0 refills | Status: DC | PRN

## 2017-04-06 MED ORDER — POTASSIUM CHLORIDE 20 MEQ PO PACK
40.0000 meq | PACK | Freq: Two times a day (BID) | ORAL | Status: AC
  Administered 2017-04-06 (×2): 40 meq via ORAL
  Filled 2017-04-06 (×2): qty 2

## 2017-04-06 MED ORDER — ENOXAPARIN SODIUM 40 MG/0.4ML ~~LOC~~ SOLN: 40.0000 mg | SUBCUTANEOUS | 0 refills | Status: DC

## 2017-04-06 MED ORDER — OXYCODONE HCL 5 MG PO TABS: 5.0000 mg | ORAL_TABLET | ORAL | 0 refills | Status: DC | PRN

## 2017-04-06 NOTE — Progress Notes (Signed)
Physical Therapy Treatment Patient Details Name: Julia McmurrayDorothy S Wickersham MRN: [id number] DOB: 02/12/1941 Today's Date: 04/06/2017    History of Present Illness Pt. is a 76 y.o. female who [facility] for [facility] secondary to OA of the Knee.    PT Comments    Pt agreeable to PT; reports 3/10 pain R knee. Initial O2 saturation low (88%) on room air. Instructed in pursed lip breathing as well as incentive spirometer expected use. Improved O2 saturation to 92-93%, which pt maintained throughout session. Requires Min A for bed mobility. Min A for sit to/from stand from multiple surfaces with cues required for safety and use of Right lower extremity. Min guard for ambulation with cues for rolling walker placement and 3 point sequencing as well as encouragement for reciprocal step pattern.  Slow progression of range; encouraged stretching as taught this session and educated on open versus closed knee position for management swelling. Pt received up in chair comfortably and plan to see pt this afternoon for continued progress of range, strength, endurance to improve functional mobility.   Follow Up Recommendations  SNF     Equipment Recommendations  Rolling walker with 5" wheels    Recommendations for Other Services       Precautions / Restrictions Precautions Precautions: Fall;Knee Restrictions Weight Bearing Restrictions: Yes RLE Weight Bearing: Weight bearing as tolerated    Mobility  Bed Mobility Overal bed mobility: Needs Assistance Bed Mobility: Supine to Sit     Supine to sit: Min assist     General bed mobility comments: Assist for LE over edge of bed  Transfers Overall transfer level: Needs assistance Equipment used: Rolling walker (2 wheeled) Transfers: Sit to/from Stand Sit to Stand: Min assist         General transfer comment: cues for equal use of LEs and proper use of hands  Ambulation/Gait Ambulation/Gait assistance: Min guard Ambulation Distance (Feet): 30  Feet (also [facility]) Assistive device: Rolling walker (2 wheeled) Gait Pattern/deviations: Step-to pattern (Partial step thru) Gait velocity: decreased Gait velocity interpretation: <1.8 ft/sec, indicative of risk for recurrent falls General Gait Details: cues for pre gait QS in stand with weight shift. Cues for rw placement and 3 point sequence   Stairs            Wheelchair Mobility    Modified Rankin (Stroke Patients Only)       Balance Overall balance assessment: Needs assistance Sitting-balance support: No upper extremity supported;Feet supported Sitting balance-Leahy Scale: Good     Standing balance support: Bilateral upper extremity supported;During functional activity Standing balance-Leahy Scale: Fair                              Cognition Arousal/Alertness: Awake/alert Behavior During Therapy: WFL for tasks assessed/performed Overall Cognitive Status: Within Functional Limits for tasks assessed                                        Exercises Total Joint Exercises Quad Sets: Strengthening;Both;20 reps (long sit; also 10 in stand prior to gait) Knee Flexion: AROM;Right;10 reps;Seated (3 positions each rep with 10 sec hold each) Goniometric ROM: 5-73 Other Exercises Other Exercises: set up for toileting and Min guard assist for personal hygiene    General Comments        Pertinent Vitals/Pain Pain Assessment: 0-10 Pain  Score: 3  Pain Location: R knee Pain Descriptors / Indicators: Aching Pain Intervention(s): Monitored during session;Repositioned;Ice applied;Premedicated before session    Home Living                      Prior Function            PT Goals (current goals can now be found in the care plan section) Progress towards PT goals: Progressing toward goals    Frequency    BID      PT Plan Current plan remains appropriate    Co-evaluation              AM-PAC PT "6 Clicks"  Daily Activity  Outcome Measure  Difficulty turning over in bed (including adjusting bedclothes, sheets and blankets)?: Total Difficulty moving from lying on back to sitting on the side of the bed? : Total Difficulty sitting down on and standing up from a chair with arms (e.g., wheelchair, bedside commode, etc,.)?: Total Help needed moving to and from a bed to chair (including a wheelchair)?: A Little Help needed walking in hospital room?: A Little Help needed climbing 3-5 steps with a railing? : A Lot 6 Click Score: 11    End of Session Equipment Utilized During Treatment: Gait belt Activity Tolerance: Patient tolerated treatment well Patient left: in chair;with call bell/phone within reach;with chair alarm set;with family/visitor present   PT Visit Diagnosis: Other abnormalities of gait and mobility (R26.89);Muscle weakness (generalized) (M62.81)     Time: 1610-9604 PT Time Calculation (min) (ACUTE ONLY): 41 min  Charges:  $Gait Training: 8-22 mins $Therapeutic Exercise: 8-22 mins $Therapeutic Activity: 8-22 mins                    G Codes:        Scot Dock, PTA 04/06/2017, 1:34 PM

## 2017-04-06 NOTE — Discharge Summary (Signed)
Physician Discharge Summary  Patient ID: Julia Bennett MRN: [id number] DOB/AGE: 1940-11-05 76 y.o.  Admit date: 04/04/2017 Discharge date: 04/07/2017  Admission Diagnoses:  PRIMARY OSTEOARTHRITIS OF RIGHT KNEE   Discharge Diagnoses: Patient Active Problem List   Diagnosis Date Noted  . Primary localized osteoarthritis of right knee 04/04/2017    Past Medical History:  Diagnosis Date  . Arthritis   . Hypertension   . Hypothyroidism      Transfusion: none   Consultants (if any):   Discharged Condition: Improved  Hospital Course: Julia Bennett is an 76 y.o. female who was admitted 04/04/2017 with a diagnosis of  Right knee osteoarthritis  and went to the operating room on 04/04/2017 and underwent the above named procedures. The patient has done well with physical therapy. Her labs have stabilized from a potassium of 2.9-4.1 yesterday.   Surgeries: Procedure(s): TOTAL KNEE ARTHROPLASTY on 04/04/2017 Patient tolerated the surgery well. Taken to PACU where she was stabilized and then transferred to the orthopedic floor.  Started on Lovenox 30 q 12 hrs. Foot pumps applied bilaterally at 80 mm. Heels elevated on bed with rolled towels. No evidence of DVT. Negative Homan. Physical therapy started on day #1 for gait training and transfer. OT started day #1 for ADL and assisted devices.  Patient's foley was d/c on day #1. Patient's IV  was d/c on day #2. On post op day 2, patients potassium was 2.9. Klor kon was added po.  On post op day #3 patient was stable and ready for discharge to SNF.  Implants: Medacta GMK sphere system right 4 femur, 4 tibia with 10 mm insert to patella and 11 x 30 mm stem on the tibia, all components cemented  TED HOSE BLE X 6 weeks, remove at night time   She was given perioperative antibiotics:  Anti-infectives    Start     Dose/Rate Route Frequency Ordered Stop   04/04/17 1800  ceFAZolin (ANCEF) IVPB 2g/100 mL premix     2 g 200 mL/hr over 30  Minutes Intravenous Every 6 hours 04/04/17 1509 04/05/17 0150   04/04/17 1021  ceFAZolin (ANCEF) 2-4 GM/100ML-% IVPB    Comments:  Julia Bennett   : cabinet override      04/04/17 1021 04/04/17 1203   04/04/17 0000  ceFAZolin (ANCEF) IVPB 2g/100 mL premix     2 g 200 mL/hr over 30 Minutes Intravenous  Once 04/03/17 2353 04/04/17 1218    .  She was given sequential compression devices, early ambulation, and lovenox for DVT prophylaxis.  She benefited maximally from the hospital stay and there were no complications.    Recent vital signs:  Vitals:   04/06/17 1044 04/06/17 2319  BP:  132/68  Pulse: 73 89  Resp:  16  Temp:  98.2 F (36.8 C)  SpO2: (!) 88% 94%    Recent laboratory studies:  Lab Results  Component Value Date   HGB 11.7 (L) 04/06/2017   HGB 12.0 04/05/2017   HGB 12.6 04/04/2017   Lab Results  Component Value Date   WBC 10.6 04/06/2017   PLT 171 04/06/2017   Lab Results  Component Value Date   INR 0.94 03/22/2017   Lab Results  Component Value Date   NA 132 (L) 04/07/2017   K 4.1 04/07/2017   CL 93 (L) 04/07/2017   CO2 32 04/07/2017   BUN 13 04/07/2017   CREATININE 1.03 (H) 04/07/2017   GLUCOSE 111 (H) 04/07/2017    Discharge  Medications:   Allergies as of 04/07/2017   No Known Allergies     Medication List    TAKE these medications   aspirin EC 81 MG tablet Take 81 mg by mouth every evening.   bisacodyl 10 MG suppository Commonly known as:  DULCOLAX Place 1 suppository (10 mg total) rectally daily as needed for moderate constipation.   CoQ10 100 MG Caps Take 1 tablet by mouth 2 (two) times daily.   docusate sodium 100 MG capsule Commonly known as:  COLACE Take 1 capsule (100 mg total) by mouth 2 (two) times daily.   enoxaparin 40 MG/0.4ML injection Commonly known as:  LOVENOX Inject 0.4 mLs (40 mg total) into the skin daily.   Fish Oil 600 MG Caps Take 1,200 mg by mouth daily.   hydrochlorothiazide 12.5 MG capsule Commonly  known as:  MICROZIDE Take 12.5 mg by mouth daily.   ibuprofen 200 MG tablet Commonly known as:  ADVIL,MOTRIN Take 200 mg by mouth every 8 (eight) hours as needed for moderate pain.   levothyroxine 100 MCG tablet Commonly known as:  SYNTHROID, LEVOTHROID Take 100 mcg by mouth daily before breakfast.   Magnesium Oxide 250 MG Tabs Take 250 mg by mouth every evening.   oxyCODONE 5 MG immediate release tablet Commonly known as:  Oxy IR/ROXICODONE Take 1-2 tablets (5-10 mg total) by mouth every 3 (three) hours as needed for breakthrough pain.   traMADol 50 MG tablet Commonly known as:  ULTRAM Take 1-2 tablets (50-100 mg total) by mouth every 6 (six) hours as needed for moderate pain.   Vitamin D3 5000 units Caps Take 5,000 Units by mouth every other day.            Durable Medical Equipment        Start     Ordered   04/04/17 1510  DME Walker rolling  Once    Question:  Patient needs a walker to treat with the following condition  Answer:  Status post total knee replacement using cement, right   04/04/17 1509   04/04/17 1510  DME 3 n 1  Once     04/04/17 1509   04/04/17 1510  DME Bedside commode  Once    Question:  Patient needs a bedside commode to treat with the following condition  Answer:  Status post total right knee replacement using cement   04/04/17 1509      Diagnostic Studies: Dg Knee 1-2 Views Right  Result Date: 04/04/2017 CLINICAL DATA:  76 year old female status post total knee replacement. EXAM: RIGHT KNEE - 1-2 VIEW COMPARISON:  Right knee radiograph 02/20/2017. FINDINGS: Two views of the right knee demonstrate postoperative changes of total knee replacement. Both femoral and tibial components of the prosthesis appear properly seated without definite periprosthetic fracture or other immediate complicating features. Small amount of gas in the joint space and in the overlying soft tissues. Anterior skin staples are noted. IMPRESSION: 1. Expected postoperative  findings of right total knee replacement without acute complicating features, as above. Electronically Signed   By: Trudie Reed M.D.   On: 04/04/2017 14:21   Ct Knee Right Wo Contrast  Result Date: 03/09/2017 CLINICAL DATA:  Right knee pain, stiffness and swelling for 2-3 years. MY knee preoperative planning. EXAM: CT OF THE right KNEE WITHOUT CONTRAST TECHNIQUE: Multidetector CT imaging of the right knee was performed according to the standard protocol. Multiplanar CT image reconstructions were also generated. COMPARISON:  None available FINDINGS: Right hip: Axial images demonstrate  mild degenerative changes but no fracture or AVN. The right SI joint appears normal. Right knee: Advanced tricompartmental degenerative changes with joint space narrowing, osteophytic spurring, bony eburnation and subchondral cystic change. 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 No fracture or osteochondral lesion. There is a moderate-sized joint effusion. Right ankle: Axial images do not demonstrate any significant degenerative changes or osteochondral abnormality. IMPRESSION: Advanced tricompartmental degenerative changes involving the right knee as described above. This is most significant in the medial compartment. Electronically Signed   By: Rudie MeyerP.  Gallerani M.D.   On: 03/09/2017 10:08    Disposition: Final discharge disposition not confirmed     Contact information for follow-up providers    Kennedy BuckerMenz, Michael, MD Follow up in 2 week(s).   Specialty:  Orthopedic Surgery Contact information: [address] [facility] [place] [id number] [phone]            Contact information for after-discharge care    Destination    [facility] Follow up.   Specialty:  Skilled Nursing Facility Contact information: [address] [place] [id number] [phone]                   Signed: Lenard ForthMUNDY, Julia Marasigan 04/07/2017, 7:24 AM

## 2017-04-06 NOTE — Progress Notes (Signed)
Occupational Therapy Treatment Patient Details Name: Julia Bennett MRN: [id number] DOB: 1941/04/23 Today's Date: 04/06/2017    History of present illness Pt. is a 76 y.o. female who was admitted to [facility] for a RTKA secondary to OA of the Knee.   OT comments  Pt seen for OT treatment session this date. Pt/spouse educated in use of AE for LB dressing tasks with verbal instruction and visual demonstration. Pt unable to tolerate trialing herself due to 10/10 pain when attempting to lower BLE leg rest on recliner. RN gave pain meds during session and noted pt had not had pain medication since last night. Pt/spouse educated in importance of pain mgt to support therapy participation. PT notified of recent pain medication to coordinate afternoon PT session to maximize pt's ability to participate. Continue to progress towards goals. STR still appropriate.   Follow Up Recommendations  SNF    Equipment Recommendations       Recommendations for Other Services      Precautions / Restrictions Precautions Precautions: Fall;Knee Required Braces or Orthoses: Knee Immobilizer - Right Knee Immobilizer - Right:  (until discontinued) Restrictions Weight Bearing Restrictions: Yes RLE Weight Bearing: Weight bearing as tolerated       Mobility Bed Mobility      General bed mobility comments: deferred, pt up in recliner  Transfers         General transfer comment: deferred due to pain; 10/10 pain in R knee when attempting to lower recliner leg rests.    Balance Overall balance assessment: Needs assistance Sitting-balance support: No upper extremity supported;Feet supported Sitting balance-Leahy Scale: Good                                  ADL either performed or assessed with clinical judgement   ADL Overall ADL's : Needs assistance/impaired                                       General ADL Comments: Pt/spouse educated in use of AE for LB dressing tasks  with verbal instruction and visual demonstration. Pt unable to tolerate trialing herself due to 10/10 pain. RN gave pain meds during session.     Vision Baseline Vision/History: No visual deficits Patient Visual Report: No change from baseline     Perception     Praxis      Cognition Arousal/Alertness: Awake/alert Behavior During Therapy: WFL for tasks assessed/performed Overall Cognitive Status: Within Functional Limits for tasks assessed                                          Exercises   Shoulder Instructions       General Comments      Pertinent Vitals/ Pain       Pain Assessment: 0-10 Pain Score: 10-Worst pain ever Pain Location: R knee with minimal movement Pain Descriptors / Indicators: Aching Pain Intervention(s): Limited activity within patient's tolerance;Monitored during session;Repositioned;Ice applied;Patient requesting pain meds-RN notified;RN gave pain meds during session  Home Living  Prior Functioning/Environment              Frequency  Min 2X/week        Progress Toward Goals  OT Goals(current goals can now be found in the care plan section)  Progress towards OT goals: Not progressing toward goals - comment;OT to reassess next treatment (limited by pain, reassess next session)  Acute Rehab OT Goals Patient Stated Goal: to get stronger OT Goal Formulation: With patient Potential to Achieve Goals: Good  Plan Discharge plan remains appropriate;Frequency remains appropriate    Co-evaluation                 AM-PAC PT "6 Clicks" Daily Activity     Outcome Measure   Help from another person eating meals?: None Help from another person taking care of personal grooming?: A Little Help from another person toileting, which includes using toliet, bedpan, or urinal?: A Lot Help from another person bathing (including washing, rinsing, drying)?: A Lot Help from  another person to put on and taking off regular upper body clothing?: None Help from another person to put on and taking off regular lower body clothing?: A Lot 6 Click Score: 17    End of Session    OT Visit Diagnosis: Muscle weakness (generalized) (M62.81)   Activity Tolerance Patient limited by pain   Patient Left in chair;with call bell/phone within reach;with chair alarm set;with family/visitor present;with SCD's reapplied;Other (comment) (polar care in place)   Nurse Communication Patient requests pain meds    Functional Assessment Tool Used: AM-PAC 6 Clicks Daily Activity;Clinical judgement Functional Limitation: Self care Self Care Current Status (Z6109(G8987): At least 60 percent but less than 80 percent impaired, limited or restricted Self Care Goal Status (U0454(G8988): At least 40 percent but less than 60 percent impaired, limited or restricted   Time: [id number] OT Time Calculation (min): 16 min  Charges: OT G-codes **NOT FOR INPATIENT CLASS** Functional Assessment Tool Used: AM-PAC 6 Clicks Daily Activity;Clinical judgement Functional Limitation: Self care Self Care Current Status (N8295(G8987): At least 60 percent but less than 80 percent impaired, limited or restricted Self Care Goal Status (A2130(G8988): At least 40 percent but less than 60 percent impaired, limited or restricted OT General Charges $OT Visit: 1 Procedure OT Treatments $Self Care/Home Management : 8-22 mins  Richrd PrimeJamie Stiller, MPH, MS, OTR/L ascom [phone] 04/06/17, 2:32 PM

## 2017-04-06 NOTE — Progress Notes (Signed)
Physical Therapy Treatment Patient Details Name: Julia McmurrayDorothy S Musquiz MRN: [id number] DOB: 05/07/1941 Today's Date: 04/06/2017    History of Present Illness Pt. is a 76 y.o. female who [facility] for a RTKA secondary to OA of the Knee.    PT Comments    Late note entry due to down time of electronic health record.  This note reflects PT session on 04/05/2017 from 1438-15:06.   Ms. Ulyses AmorHumble made good progress with mobility but continues to require assist with transfers.  Her ambulatory distance is limited due to fatigue.  SNF remains most appropriate d/c plan.    Follow Up Recommendations  SNF     Equipment Recommendations  Rolling walker with 5" wheels (TBD at next venue of care if discharges to SNF)    Recommendations for Other Services       Precautions / Restrictions Precautions Precautions: Fall;Knee Required Braces or Orthoses: Knee Immobilizer - Right Knee Immobilizer - Right:  (Order says "until discontinued") Restrictions Weight Bearing Restrictions: Yes RLE Weight Bearing: Weight bearing as tolerated    Mobility  Bed Mobility Overal bed mobility: Needs Assistance Bed Mobility: Supine to Sit     Supine to sit: Min guard;HOB elevated     General bed mobility comments: Increased time and effort with use of bed rail.  Transfers Overall transfer level: Needs assistance Equipment used: Rolling walker (2 wheeled) Transfers: Sit to/from Stand Sit to Stand: Min assist         General transfer comment: Min assist to boost to standing with cues for proper hand placement.  Pt with poorly controlled descent to chair when sitting.  Ambulation/Gait Ambulation/Gait assistance: Min guard Ambulation Distance (Feet): 50 Feet Assistive device: Rolling walker (2 wheeled) Gait Pattern/deviations: Step-to pattern;Decreased stride length;Decreased stance time - right;Decreased step length - left;Decreased weight shift to right;Antalgic;Trunk flexed Gait velocity:  decreased Gait velocity interpretation: <1.8 ft/sec, indicative of risk for recurrent falls General Gait Details: Cues for upright posture as pt demonstrates flexed posture (husband reports this is her baseline).  Pt fatigues at end of ambulation.     Stairs            Wheelchair Mobility    Modified Rankin (Stroke Patients Only)       Balance Overall balance assessment: Needs assistance Sitting-balance support: No upper extremity supported;Feet supported Sitting balance-Leahy Scale: Good     Standing balance support: Bilateral upper extremity supported;During functional activity Standing balance-Leahy Scale: Poor Standing balance comment: Relies on UE support for static and dynamic activities                            Cognition Arousal/Alertness: Awake/alert Behavior During Therapy: WFL for tasks assessed/performed Overall Cognitive Status: Within Functional Limits for tasks assessed                                        Exercises Total Joint Exercises Ankle Circles/Pumps: AROM;Both;10 reps;Supine Quad Sets: Strengthening;Both;10 reps;Supine Hip ABduction/ADduction: AAROM;Right;10 reps;Supine Knee Flexion: AAROM;Right;5 reps;Seated;Other (comment) (with 5 second holds) Goniometric ROM: R knee flexion AAROM 77 deg    General Comments        Pertinent Vitals/Pain Pain Assessment: 0-10 Pain Score: 6  Pain Location: R knee Pain Descriptors / Indicators: Aching Pain Intervention(s): Limited activity within patient's tolerance;Monitored during session;Repositioned    Home Living  Prior Function            PT Goals (current goals can now be found in the care plan section) Acute Rehab PT Goals Patient Stated Goal: to get stronger PT Goal Formulation: With patient Time For Goal Achievement: 04/18/17 Potential to Achieve Goals: Good Progress towards PT goals: Progressing toward goals     Frequency    BID      PT Plan Current plan remains appropriate    Co-evaluation              AM-PAC PT "6 Clicks" Daily Activity  Outcome Measure  Difficulty turning over in bed (including adjusting bedclothes, sheets and blankets)?: Total Difficulty moving from lying on back to sitting on the side of the bed? : Total Difficulty sitting down on and standing up from a chair with arms (e.g., wheelchair, bedside commode, etc,.)?: Total Help needed moving to and from a bed to chair (including a wheelchair)?: A Little Help needed walking in hospital room?: A Little Help needed climbing 3-5 steps with a railing? : A Lot 6 Click Score: 11    End of Session Equipment Utilized During Treatment: Gait belt;Right knee immobilizer Activity Tolerance: Patient tolerated treatment well;Patient limited by fatigue Patient left: in chair;with call bell/phone within reach;with chair alarm set;with family/visitor present Nurse Communication: Mobility status PT Visit Diagnosis: Other abnormalities of gait and mobility (R26.89);Muscle weakness (generalized) (M62.81)     Time: 1610-9604 PT Time Calculation (min) (ACUTE ONLY): 28 min  Charges:  $Gait Training: 8-22 mins $Therapeutic Exercise: 8-22 mins                    G Codes:       Encarnacion Chu PT, DPT 04/06/2017, 8:27 AM

## 2017-04-06 NOTE — Progress Notes (Signed)
Pt has been up to chair and worked with PT today and did well. Potassium 2.9 this AM. PA aware and pt is receiving supplement for such per his order with a recheck in the AM. Pt's pain as been well controlled today. Spouse in and supportive today. Vital signs have been stable. Pt given milk of magnesia per MD order today. Plan is to d/c pt to SNF [facility] tomorrow.

## 2017-04-06 NOTE — Discharge Instructions (Signed)

## 2017-04-06 NOTE — Progress Notes (Signed)
Physical Therapy Treatment Patient Details Name: Julia Bennett MRN: [id number] DOB: 1940/09/12 Today's Date: 04/06/2017    History of Present Illness Pt. is a 76 y.o. female who was admitted to [facility] for a RTKA secondary to OA of the Knee.    PT Comments    Pt agreeable to PT; reports increased R knee pain to 8/10 with movement post medication; reports little to no pain at rest. Pt re educated in the importance of movement with moderate or less pain. Pt requires continued cueing for proper foot and hand placement with transfers as well as improved use of Right lower extremity; does show some improvement with subsequent transfers this session although continues to require Min A. Limited ambulation due to pain with weight transfers to Right lower extremity. Pt feels unable to bend R knee in seated position much beyond 60 degrees. Pt returned to supine and educated/practiced supine self assisted R knee flexion in small range. Spouse educated as well. Pt encouraged to practice range every 2 hours. Continue PT to progress endurance, strength, range and balance to improve all functional mobility.   Follow Up Recommendations  SNF     Equipment Recommendations  Rolling walker with 5" wheels (TBD at next venue of care if discharges to SNF)    Recommendations for Other Services       Precautions / Restrictions Precautions Precautions: Fall;Knee Required Braces or Orthoses: Knee Immobilizer - Right Knee Immobilizer - Right:  (Order says "until discontinued") Restrictions Weight Bearing Restrictions: Yes RLE Weight Bearing: Weight bearing as tolerated    Mobility  Bed Mobility Overal bed mobility: Needs Assistance Bed Mobility: Sit to Supine     Supine to sit: Min assist Sit to supine: Min assist   General bed mobility comments: Assist for LEs; use of trapeze to reposition upward in bed  Transfers Overall transfer level: Needs assistance Equipment used: Rolling walker (2  wheeled) Transfers: Sit to/from Stand Sit to Stand: Min assist         General transfer comment: Cues for increased use of RLE and improved foot placment to allow for stand balance and controlled descent. Performed from chair [facility] and bed. Improved with subsequent transfers  Ambulation/Gait Ambulation/Gait assistance: Min guard Ambulation Distance (Feet): 5 Feet Assistive device: Rolling walker (2 wheeled) Gait Pattern/deviations: Step-to pattern (Partial step thru) Gait velocity: decreased Gait velocity interpretation: <1.8 ft/sec, indicative of risk for recurrent falls General Gait Details: Difficulty clearing LLE due to pain in RLE with weight transfer   Stairs            Wheelchair Mobility    Modified Rankin (Stroke Patients Only)       Balance Overall balance assessment: Needs assistance Sitting-balance support: No upper extremity supported;Feet supported Sitting balance-Leahy Scale: Good     Standing balance support: Bilateral upper extremity supported;During functional activity Standing balance-Leahy Scale: Fair                              Cognition Arousal/Alertness: Awake/alert Behavior During Therapy: WFL for tasks assessed/performed Overall Cognitive Status: Within Functional Limits for tasks assessed                                        Exercises Total Joint Exercises Ankle Circles/Pumps: AROM;Both;10 reps Quad Sets: Strengthening;Both;20 reps (long sit; also 10 in stand prior to gait)  Knee Flexion: AAROM;Right;10 reps;Supine (2 sets. self assisted) Goniometric ROM: 5-73 Other Exercises Other Exercises: set up for toileting and Min guard assist for personal hygiene    General Comments        Pertinent Vitals/Pain Pain Assessment: 0-10 Pain Score: 8  Pain Location: R knee Pain Descriptors / Indicators: Aching;Operative site guarding Pain Intervention(s): Limited activity within patient's tolerance;Ice  applied    Home Living                      Prior Function            PT Goals (current goals can now be found in the care plan section) Acute Rehab PT Goals Patient Stated Goal: to get stronger PT Goal Formulation: With patient Time For Goal Achievement: 04/18/17 Potential to Achieve Goals: Good Progress towards PT goals: Progressing toward goals    Frequency    BID      PT Plan Current plan remains appropriate    Co-evaluation              AM-PAC PT "6 Clicks" Daily Activity  Outcome Measure  Difficulty turning over in bed (including adjusting bedclothes, sheets and blankets)?: A Little Difficulty moving from lying on back to sitting on the side of the bed? : Total Difficulty sitting down on and standing up from a chair with arms (e.g., wheelchair, bedside commode, etc,.)?: Total Help needed moving to and from a bed to chair (including a wheelchair)?: A Little Help needed walking in hospital room?: A Little Help needed climbing 3-5 steps with a railing? : A Lot 6 Click Score: 13    End of Session Equipment Utilized During Treatment: Gait belt Activity Tolerance: Patient limited by pain Patient left: in bed;with call bell/phone within reach;with bed alarm set;with family/visitor present Nurse Communication: Mobility status PT Visit Diagnosis: Other abnormalities of gait and mobility (R26.89);Muscle weakness (generalized) (M62.81)     Time: [id number] PT Time Calculation (min) (ACUTE ONLY): 24 min  Charges:  $Gait Training: 8-22 mins $Therapeutic Exercise: 8-22 mins $Therapeutic Activity: 8-22 mins                    G Codes:        Scot DockHeidi E Barnes, PTA 04/06/2017, 4:36 PM

## 2017-04-06 NOTE — Clinical Social Work Note (Signed)
Per PA, pt will likely discharge on Friday, 04/07/2017. CSW updated facility and they will be ready to accept pt when stable for transfer. CSW will continue to follow.   Dede QuerySarah Rashun Grattan, MSW, LCSW  Clinical Social Worker  [phone]

## 2017-04-06 NOTE — Progress Notes (Signed)
   Subjective: 2 Days Post-Op Procedure(s) (LRB): TOTAL KNEE ARTHROPLASTY (Right) Patient reports pain as mild.   Patient is well, and has had no acute complaints or problems Denies any CP, SOB, ABD pain. We will continue therapy today.    Objective: Vital signs in last 24 hours: Temp:  [97.8 F (36.6 C)-99 F (37.2 C)] 97.8 F (36.6 C) (08/08 2357) Pulse Rate:  [76-82] 82 (08/08 2357) Resp:  [16-20] 16 (08/08 2357) BP: (138-170)/(68-86) 138/68 (08/08 2357) SpO2:  [91 %-98 %] 91 % (08/08 2357)  Intake/Output from previous day: 08/08 0701 - 08/09 0700 In: 720 [P.O.:720] Out: -  Intake/Output this shift: No intake/output data recorded.   Recent Labs  04/04/17 1034 04/04/17 1044 04/05/17 0327 04/06/17 0314  HGB 13.5 12.6 12.0 11.7*    Recent Labs  04/05/17 0327 04/06/17 0314  WBC 7.1 10.6  RBC 3.99 3.93  HCT 35.3 34.0*  PLT 194 171    Recent Labs  04/05/17 0327 04/06/17 0314  NA 139 133*  K 3.7 2.9*  CL 104 93*  CO2 28 32  BUN 17 12  CREATININE 1.00 1.02*  GLUCOSE 116* 121*  CALCIUM 8.5* 8.7*   No results for input(s): LABPT, INR in the last 72 hours.  EXAM General - Patient is Alert, Appropriate and Oriented Extremity - Neurovascular intact Sensation intact distally Intact pulses distally Dorsiflexion/Plantar flexion intact No cellulitis present Compartment soft Dressing - dressing C/D/I and no drainage, dressing changed Motor Function - intact, moving foot and toes well on exam.   Past Medical History:  Diagnosis Date  . Arthritis   . Hypertension   . Hypothyroidism     Assessment/Plan:   2 Days Post-Op Procedure(s) (LRB): TOTAL KNEE ARTHROPLASTY (Right) Active Problems:   Primary localized osteoarthritis of right knee   hypokalemia Estimated body mass index is 25.82 kg/m as calculated from the following:   Height as of this encounter: 5\' 6"  (1.676 m).   Weight as of this encounter: 72.6 kg (160 lb). Advance diet Up with  therapy  Needs BM Hypokalemia - Klor kon 40 meq BID, recheck labs in the am CM to assist with discharge, SNF tomorrow   DVT Prophylaxis - Lovenox, Foot Pumps and TED hose Weight-Bearing as tolerated to rigjt leg   T. Cranston Neighborhris Malkie Wille, PA-C [facility] 04/06/2017, 7:04 AM

## 2017-04-07 LAB — BASIC METABOLIC PANEL
Anion gap: 7 (ref 5–15)
BUN: 13 mg/dL (ref 6–20)
CHLORIDE: 93 mmol/L — AB (ref 101–111)
CO2: 32 mmol/L (ref 22–32)
Calcium: 8.7 mg/dL — ABNORMAL LOW (ref 8.9–10.3)
Creatinine, Ser: 1.03 mg/dL — ABNORMAL HIGH (ref 0.44–1.00)
GFR calc Af Amer: 59 mL/min — ABNORMAL LOW (ref >60)
GFR calc non Af Amer: 51 mL/min — ABNORMAL LOW (ref >60)
GLUCOSE: 111 mg/dL — AB (ref 65–99)
POTASSIUM: 4.1 mmol/L (ref 3.5–5.1)
Sodium: 132 mmol/L — ABNORMAL LOW (ref 135–145)

## 2017-04-07 NOTE — Care Management Important Message (Signed)
Important Message  Patient Details  Name: Ernestine McmurrayDorothy S Gershman MRN: [id number] Date of Birth: 08/18/1941   Medicare Important Message Given:  Yes    Marily MemosLisa M Gaspar Fowle, RN 04/07/2017, 8:05 AM

## 2017-04-07 NOTE — Progress Notes (Addendum)
   Subjective: 3 Days Post-Op Procedure(s) (LRB): TOTAL KNEE ARTHROPLASTY (Right) Patient reports pain as mild.   Patient is well, and has had no acute complaints or problems Denies any CP, SOB, ABD pain. We will continue therapy today.    Objective: Vital signs in last 24 hours: Temp:  [97.7 F (36.5 C)-98.2 F (36.8 C)] 98.2 F (36.8 C) (08/09 2319) Pulse Rate:  [73-89] 89 (08/09 2319) Resp:  [16] 16 (08/09 2319) BP: (132-146)/(64-68) 132/68 (08/09 2319) SpO2:  [88 %-94 %] 94 % (08/09 2319)  Intake/Output from previous day: 08/09 0701 - 08/10 0700 In: 360 [P.O.:360] Out: -  Intake/Output this shift: No intake/output data recorded.   Recent Labs  04/04/17 1034 04/04/17 1044 04/05/17 0327 04/06/17 0314  HGB 13.5 12.6 12.0 11.7*    Recent Labs  04/05/17 0327 04/06/17 0314  WBC 7.1 10.6  RBC 3.99 3.93  HCT 35.3 34.0*  PLT 194 171    Recent Labs  04/06/17 0314 04/07/17 0407  NA 133* 132*  K 2.9* 4.1  CL 93* 93*  CO2 32 32  BUN 12 13  CREATININE 1.02* 1.03*  GLUCOSE 121* 111*  CALCIUM 8.7* 8.7*   No results for input(s): LABPT, INR in the last 72 hours.  EXAM General - Patient is Alert, Appropriate and Oriented Extremity - Neurovascular intact Sensation intact distally Intact pulses distally Dorsiflexion/Plantar flexion intact No cellulitis present Compartment soft Dressing - dressing C/D/I and no drainage, dressing changed Motor Function - intact, moving foot and toes well on exam.   Past Medical History:  Diagnosis Date  . Arthritis   . Hypertension   . Hypothyroidism     Assessment/Plan:   3 Days Post-Op Procedure(s) (LRB): TOTAL KNEE ARTHROPLASTY (Right) Active Problems:   Primary localized osteoarthritis of right knee   hypokalemia Estimated body mass index is 25.82 kg/m as calculated from the following:   Height as of this encounter: 5\' 6"  (1.676 m).   Weight as of this encounter: 72.6 kg (160 lb). Advance diet Up with  therapy  Needs BM Hypokalemia -Improved from 2.9-4.1. CM to assist with discharge, SNF today after a bowel movement.   DVT Prophylaxis - Lovenox, Foot Pumps and TED hose Weight-Bearing as tolerated to rigjt leg   Dedra Skeensodd Tinea Nobile, PA-C [facility] 04/07/2017, 6:44 AM

## 2017-04-07 NOTE — Progress Notes (Signed)
Report given to Kim at [facility]. Patient going to room 804. Nurse removed IV, printed dc instructions for patient and changed dressing. NT prepared patient for transport via EMS.

## 2017-04-07 NOTE — Progress Notes (Signed)
Physical Therapy Treatment Patient Details Name: Julia McmurrayDorothy S Bennett MRN: [id number] DOB: 04/20/1941 Today's Date: 04/07/2017    History of Present Illness Pt. is a 76 y.o. female who [facility] for a RTKA secondary to OA of the Knee.    PT Comments    Pt presents with deficits in strength, transfers, mobility, gait, balance, R knee ROM and activity tolerance.  Pt Min A with bed mob tasks with extra time and effort required.  Transfer training to/from multiple surfaces with CGA and Mod verbal cues for proper sequencing.  Pt able to amb 1 x 40', 1 x 10', and 1 x 15' this session with RW and CGA with step-to antalgic gait pattern and heavy use of BUEs on RW.  With max verbal cues pt able to advance LLE slightly past RLE but poor carryover.   Pt remains unable to perform independent RLE SLR and KI donned with WB activities.  Pt will benefit from PT services in a SNF setting to address above deficits for decreased caregiver assistance upon discharge.     Follow Up Recommendations  SNF     Equipment Recommendations  Rolling walker with 5" wheels    Recommendations for Other Services       Precautions / Restrictions Precautions Precautions: Fall;Knee Precaution Booklet Issued: Yes (comment) Required Braces or Orthoses: Knee Immobilizer - Right (Pt remains unable to perform independent RLE SLR) Restrictions Weight Bearing Restrictions: Yes RLE Weight Bearing: Weight bearing as tolerated    Mobility  Bed Mobility Overal bed mobility: Needs Assistance Bed Mobility: Supine to Sit;Sit to Supine     Supine to sit: Min assist Sit to supine: Min assist   General bed mobility comments: Assist for LEs; use of trapeze to reposition upward in bed Positioning education provided to pt/spouse to properly encourage R knee ext PROM while in supine or in recliner  Transfers Overall transfer level: Needs assistance Equipment used: Rolling walker (2 wheeled) Transfers: Sit to/from  Stand Sit to Stand: Min guard         General transfer comment: Mod verbal cues for sequencing  Ambulation/Gait Ambulation/Gait assistance: Min guard Ambulation Distance (Feet): 50 Feet Assistive device: Rolling walker (2 wheeled) Gait Pattern/deviations: Step-to pattern;Antalgic   Gait velocity interpretation: Below normal speed for age/gender General Gait Details: Heavy use of BUEs on RW and slow cadence during amb with max verbal cues for decreased WB through BUEs and for attempts at increased LLE step length with limited carryover   Stairs            Wheelchair Mobility    Modified Rankin (Stroke Patients Only)       Balance Overall balance assessment: Needs assistance Sitting-balance support: No upper extremity supported;Feet supported Sitting balance-Leahy Scale: Good     Standing balance support: Bilateral upper extremity supported;During functional activity Standing balance-Leahy Scale: Fair Standing balance comment: Relies on BUE support for static and dynamic activities                            Cognition Arousal/Alertness: Awake/alert Behavior During Therapy: WFL for tasks assessed/performed Overall Cognitive Status: Within Functional Limits for tasks assessed                                        Exercises Total Joint Exercises Ankle Circles/Pumps: AROM;Both;10 reps;15 reps Quad Sets: Strengthening;Right;10  reps;15 reps Gluteal Sets: Strengthening;Both;10 reps Hip ABduction/ADduction: AAROM;Right;10 reps Straight Leg Raises: AAROM;Right;10 reps;15 reps Long Arc Quad: AROM;Right;10 reps;15 reps Knee Flexion: AROM;Right;10 reps;15 reps Goniometric ROM: R knee A/AAROM:  flex 65/73 deg, ext -10/-5 deg  Marching in Standing: AROM;Both;10 reps Other Exercises Other Exercises: Pt/spouse education on HEP for R knee ROM: seated R knee flex and R knee QS 10-15 reps each 5-6x/day    General Comments        Pertinent  Vitals/Pain Pain Assessment: No/denies pain    Home Living                      Prior Function            PT Goals (current goals can now be found in the care plan section) Progress towards PT goals: Progressing toward goals    Frequency    BID      PT Plan Current plan remains appropriate    Co-evaluation              AM-PAC PT "6 Clicks" Daily Activity  Outcome Measure  Difficulty turning over in bed (including adjusting bedclothes, sheets and blankets)?: Total Difficulty moving from lying on back to sitting on the side of the bed? : Total Difficulty sitting down on and standing up from a chair with arms (e.g., wheelchair, bedside commode, etc,.)?: Total Help needed moving to and from a bed to chair (including a wheelchair)?: A Little Help needed walking in hospital room?: A Little Help needed climbing 3-5 steps with a railing? : A Lot 6 Click Score: 11    End of Session Equipment Utilized During Treatment: Gait belt Activity Tolerance: Patient limited by pain;Patient limited by fatigue Patient left: in chair;with chair alarm set;with SCD's reapplied;with call bell/phone within reach;with family/visitor present;Other (comment) (Polar care donned to R knee) Nurse Communication: Mobility status PT Visit Diagnosis: Other abnormalities of gait and mobility (R26.89);Muscle weakness (generalized) (M62.81)     Time: 1018 (Break taken for pt toileting, returned 11:15-11:50 to comlete session)-1038 PT Time Calculation (min) (ACUTE ONLY): 20 min  Charges:  $Gait Training: 8-22 mins $Therapeutic Exercise: 23-37 mins $Therapeutic Activity: 8-22 mins                    G Codes:       DElly Modena PT, DPT 04/07/17, 12:10 PM

## 2017-04-07 NOTE — Clinical Social Work Placement (Signed)
   CLINICAL SOCIAL WORK PLACEMENT  NOTE  Date:  04/07/2017  Patient Details  Name: Julia McmurrayDorothy S Stoermer MRN: [id number] Date of Birth: 07/05/1941  Clinical Social Work is seeking post-discharge placement for this patient at the Skilled  Nursing Facility level of care (*CSW will initial, date and re-position this form in  chart as items are completed):  Yes   Patient/family provided with [facility] Clinical Social Work Department's list of facilities offering this level of care within the geographic area requested by the patient (or if unable, by the patient's family).  Yes   Patient/family informed of their freedom to choose among providers that offer the needed level of care, that participate in Medicare, Medicaid or managed care program needed by the patient, have an available bed and are willing to accept the patient.  Yes   Patient/family informed of [facility]'s ownership interest in [facility] and  [facility], as well as of the fact that they are under no obligation to receive care at these facilities.  PASRR submitted to EDS on 04/04/17     PASRR number received on 04/04/17     Existing PASRR number confirmed on       FL2 transmitted to all facilities in geographic area requested by pt/family on 04/05/17     FL2 transmitted to all facilities within larger geographic area on       Patient informed that his/her managed care company has contracts with or will negotiate with certain facilities, including the following:        Yes   Patient/family informed of bed offers received.  Patient chooses bed at  ([facility] )     Physician recommends and patient chooses bed at      Patient to be transferred to  ([facility] ) on 04/07/17.  Patient to be transferred to facility by  [facility]     Patient family notified on 04/07/17 of transfer.  Name of family member notified:   (Patient's husband [name] is at bedside and aware of D/C today. )     PHYSICIAN       Additional  Comment:    _______________________________________________ Aarionna Germer, Darleen CrockerBailey M, LCSW 04/07/2017, 11:02 AM

## 2017-04-07 NOTE — Progress Notes (Signed)
EMS called

## 2017-04-07 NOTE — Progress Notes (Signed)
Patient is medically stable for D/C to [facility] today. Per Jomarie LongsJoseph [facility] liaison patient can come today to room 804. RN will call report to RN Konrad DoloresKim Hicks at [phone] and arrange EMS for transport. Clinical Child psychotherapistocial Worker (CSW) sent D/C orders to [facility] via [facility]. Patient is aware of above. Patient's husband [name] is at bedside and aware of above. Please reconsult if future social work needs arise. CSW signing off.   B[employer] Hughes IncorporatedBailey Mallisa [place], LCSW [phone]

## 2017-04-07 NOTE — Progress Notes (Signed)
EMS here to transfer patient to [facility].

## 2017-04-18 NOTE — Anesthesia Postprocedure Evaluation (Signed)
Anesthesia Post Note  Patient: Julia Bennett  Procedure(s) Performed: Procedure(s) (LRB): TOTAL KNEE ARTHROPLASTY (Right)  Patient location during evaluation: PACU Anesthesia Type: Spinal Level of consciousness: oriented and awake and alert Pain management: pain level controlled Vital Signs Assessment: post-procedure vital signs reviewed and stable Respiratory status: spontaneous breathing, respiratory function stable and patient connected to nasal cannula oxygen Cardiovascular status: blood pressure returned to baseline and stable Postop Assessment: no headache and no backache Anesthetic complications: no     Last Vitals:  Vitals:   04/07/17 0807 04/07/17 1154  BP: 140/85   Pulse: 92 89  Resp: 20   Temp: 36.9 C   SpO2: 94% 93%    Last Pain:  Vitals:   04/07/17 1626  TempSrc:   PainSc: 2                  Yevette Edwards

## 2017-09-27 ENCOUNTER — Encounter: Payer: Self-pay | Admitting: Obstetrics and Gynecology

## 2017-10-11 ENCOUNTER — Ambulatory Visit (INDEPENDENT_AMBULATORY_CARE_PROVIDER_SITE_OTHER): Payer: Medicare Other | Admitting: Obstetrics and Gynecology

## 2017-10-11 ENCOUNTER — Encounter: Payer: Self-pay | Admitting: Obstetrics and Gynecology

## 2017-10-11 VITALS — BP 165/89 | HR 66 | Ht 66.0 in | Wt 162.0 lb

## 2017-10-11 DIAGNOSIS — N8111 Cystocele, midline: Secondary | ICD-10-CM | POA: Diagnosis not present

## 2017-10-11 DIAGNOSIS — N819 Female genital prolapse, unspecified: Secondary | ICD-10-CM

## 2017-10-11 DIAGNOSIS — Z90710 Acquired absence of both cervix and uterus: Secondary | ICD-10-CM | POA: Insufficient documentation

## 2017-10-11 DIAGNOSIS — K469 Unspecified abdominal hernia without obstruction or gangrene: Secondary | ICD-10-CM

## 2017-10-11 LAB — POCT URINALYSIS DIPSTICK
Bilirubin: NEGATIVE
Glucose, UA: NEGATIVE
Ketones, UA: NEGATIVE
LEUKOCYTES UA: NEGATIVE
NITRITE UA: NEGATIVE
ODOR: NEGATIVE
PROTEIN UA: NEGATIVE
Spec Grav, UA: 1.01 (ref 1.010–1.025)
Urobilinogen, UA: 0.2 E.U./dL
pH, UA: 6.5 (ref 5.0–8.0)

## 2017-10-11 NOTE — Progress Notes (Signed)
GYN ENCOUNTER NOTE  Subjective:       Julia Bennett is a 77 y.o. G6P1001 female is here for gynecologic evaluation of the following issues:  1.  Vaginal vault prolapse.    77 year old female para 1001, menopausal, on no HRT therapy, status post TVH with anterior colporrhaphy in 2009 (no records available for review), presents for evaluation of worsening pelvic organ prolapse. Symptoms became most prominent over the past 3 months; patient and husband report that the symptoms may have been ongoing for the last 1-2 years. Patient reports the vagina to be prolapsing outside the introitus.  GU symptoms: Urinary frequency-6-8/day Nocturia-1-2 per night Stress urinary incontinence symptoms-none Urge incontinence symptoms-minimal Incomplete bladder emptying-yes History of UTI-he has Patient is sexually active Patient reports low back pain and evidence of a bulge at the introitus; no vaginal discharge or vaginal bleeding; urine has slight odor  GI symptoms: Bowel movement frequency-daily Splinting history-negative   Obstetric History OB History  Gravida Para Term Preterm AB Living  1 1 1     1   SAB TAB Ectopic Multiple Live Births          1    # Outcome Date GA Lbr Len/2nd Weight Sex Delivery Anes PTL Lv  1 Term 9    M Vag-Spont   LIV      Past Medical History:  Diagnosis Date  . Arthritis   . Hypertension   . Hypothyroidism     Past Surgical History:  Procedure Laterality Date  . ABDOMINAL HYSTERECTOMY     05/2008- at [facility]  . ANTERIOR AND POSTERIOR VAGINAL REPAIR    . APPENDECTOMY    . COLONOSCOPY    . ROTATOR CUFF REPAIR Right   . TOTAL KNEE ARTHROPLASTY Right 04/04/2017   Procedure: TOTAL KNEE ARTHROPLASTY;  Surgeon: Kennedy Bucker, MD;  Location: [facility] ORS;  Service: Orthopedics;  Laterality: Right;    Current Outpatient Medications on File Prior to Visit  Medication Sig Dispense Refill  . aspirin EC 81 MG tablet Take 81 mg by mouth every evening.    .  cephALEXin (KEFLEX) 500 MG capsule Take 4 pills one hour prior to dental appointment.    . Cholecalciferol (VITAMIN D3) 5000 units CAPS Take 5,000 Units by mouth every other day.    . Coenzyme Q10 (COQ10) 100 MG CAPS Take 1 tablet by mouth 2 (two) times daily.    . hydrochlorothiazide (MICROZIDE) 12.5 MG capsule Take 12.5 mg by mouth daily.    Marland Kitchen ibuprofen (ADVIL,MOTRIN) 200 MG tablet Take 200 mg by mouth every 8 (eight) hours as needed for moderate pain.    Marland Kitchen levothyroxine (SYNTHROID, LEVOTHROID) 100 MCG tablet Take 100 mcg by mouth daily before breakfast.    . Magnesium Oxide 250 MG TABS Take 250 mg by mouth every evening.    . Omega-3 Fatty Acids (FISH OIL) 600 MG CAPS Take 1,200 mg by mouth daily.     No current facility-administered medications on file prior to visit.     No Known Allergies  Social History   Socioeconomic History  . Marital status: Married    Spouse name: Not on file  . Number of children: Not on file  . Years of education: Not on file  . Highest education level: Not on file  Social Needs  . Financial resource strain: Not on file  . Food insecurity - worry: Not on file  . Food insecurity - inability: Not on file  . Transportation needs - medical: Not  on file  . Transportation needs - non-medical: Not on file  Occupational History  . Not on file  Tobacco Use  . Smoking status: Never Smoker  . Smokeless tobacco: Never Used  Substance and Sexual Activity  . Alcohol use: No  . Drug use: No  . Sexual activity: Yes    Birth control/protection: Surgical  Other Topics Concern  . Not on file  Social History Narrative  . Not on file    Family History  Problem Relation Age of Onset  . Breast cancer Neg Hx   . Ovarian cancer Neg Hx   . Colon cancer Neg Hx   . Diabetes Neg Hx     The following portions of the patient's history were reviewed and updated as appropriate: allergies, current medications, past family history, past medical history, past social  history, past surgical history and problem list.  Review of Systems Comprehensive review of systems is positive for that noted in the HPI only Objective:   BP (!) 165/89   Pulse 66   Ht 5\' 6"  (1.676 m)   Wt 162 lb (73.5 kg)   BMI 26.15 kg/m  CONSTITUTIONAL: Well-developed, well-nourished female in no acute distress.  HENT:  Normocephalic, atraumatic.  NECK: Not examined SKIN: Skin is warm and dry. No rash noted. Not diaphoretic. No erythema. No pallor. NEUROLGIC: Alert and oriented to person, place, and time. PSYCHIATRIC: Normal mood and affect. Normal behavior. Normal judgment and thought content. CARDIOVASCULAR:Not Examined RESPIRATORY: Not Examined BREASTS: Not Examined ABDOMEN: Soft, non distended; Non tender.  No Organomegaly. PELVIC:  External Genitalia: Mild atrophic changes  BUS: Normal; Q tip test 30 degrees with Valsalva  Vagina: Mild to moderate atrophy; third-degree cystocele and enterocele present  Cervix: Surgically absent  Uterus: Surgically absent  Adnexa: Nonpalpable and nontender  RV: Normal external exam; normal sphincter tone; no rectal masses  Bladder: Nontender MUSCULOSKELETAL: Normal range of motion. No tenderness.  No cyanosis, clubbing, or edema.  PROCEDURE: Pessary fitting  #5 ring with diaphragm support-too small  #6 ring with diaphragm support-pushed out with Valsalva  76 mm gel horn-good fit (not desired because patient and spouse desire to continue with intercourse)   Assessment:   1. Vaginal vault prolapse  2. Cystocele, midline  3. Enterocele  4. Status post vaginal hysterectomy   The patient is 10 years status post TVH with anterior colporrhaphy (by history), medical records not available, now with vaginal vault eversion including third-degree cystocele and enterocele which is symptomatic primarily from pelvic pressure, low back pain, and vaginal bulge, along with incomplete bladder emptying.  I did not do a postvoid residual test at  this visit.  She does not experience any stress incontinence and has minimal urge symptoms.  She is not having any bowel function difficulties.  The patient's husband and daughter report that with recent knee replacement surgery, the patient had some general anesthetic side effects which affected her memory short-term.  If she is to have surgery they would like to avoid general anesthetics if at all possible.  I did discuss this with the family and stated that it certainly is possible to have regional anesthesia. Given the fact that this is a recurrent prolapse condition, and she is status post vaginal hysterectomy with prior anterior colporrhaphy, I have recommended that she see urogynecology who can best manage this recurrent prolapse condition, especially with the fact that correcting her prolapse may produce a new onset stress incontinence (which could be treated with a concurrent pubovaginal sling).  The family is in agreement and she is referred to tertiary care for further surgical management. The patient did have successful fitting of the gel horn pessary, but this is unsatisfactory because the patient and her husband wish to continue having intercourse. Plan:   1.  Referral to [facility] urogynecology. 2.  Successful pitting of a gel horn pessary, not desired for use at this time. 3.  Begin using Premarin cream 1/2 g intravaginal nightly for the next 30 days to help with vaginal atrophy and possible future surgery.  A total of 45 minutes were spent face-to-face with the patient during the encounter with greater than 50% dealing with counseling and coordination of care.  Herold Harms, MD  Note: This dictation was prepared with Dragon dictation along with smaller phrase technology. Any transcriptional errors that result from this process are unintentional.

## 2017-10-11 NOTE — Patient Instructions (Addendum)
1.  Pessary trial was performed today.  The ring with diaphragm support pessary  did not fit well.  The gel horn pessary works well.  However, because the gel horn pessary is not the pessary of choice in women who prefer to remain sexually active, this is not an acceptable option. 2.  Vaginal vault prolapse repair is the optimal choice in this situation.  Regional anesthesia is to be considered so as to minimize impact on brain function. 3.  Recommend [facility] for further evaluation and surgical management.  Subspecialty urogynecology will optimize outcome in repairing vaginal vault prolapse while minimizing risk for postsurgical stress incontinence. 4.  Patient is to use Premarin cream 1/2 g intravaginal nightly and then attempts to prepare vaginal tissue for surgery.

## 2017-10-13 LAB — URINE CULTURE: ORGANISM ID, BACTERIA: NO GROWTH

## 2017-10-27 ENCOUNTER — Telehealth: Payer: Self-pay | Admitting: Obstetrics and Gynecology

## 2017-10-27 NOTE — Telephone Encounter (Signed)
The patient called and stated that she will run out of the samples that were given to her by Dr. Tommi Bennett, The office she is referred to will not see her until March the 20 th, 2019. The patient would like for Julia Bennett to give her a call back Monday to pick up more, she will also "Be on this side of town" on Monday. Please advise.

## 2017-10-30 ENCOUNTER — Other Ambulatory Visit: Payer: Self-pay

## 2017-10-30 MED ORDER — ESTROGENS, CONJUGATED 0.625 MG/GM VA CREA: 0.2500 | TOPICAL_CREAM | Freq: Every day | VAGINAL | 6 refills | Status: DC

## 2017-10-30 NOTE — Telephone Encounter (Signed)
Pt stop by office today for premarin samples. Erx premarin to [facility].

## 2019-03-29 IMAGING — CT CT KNEE*R* W/O CM
1 of 3 series · 7 of 14 positions shown, 9 images · non-contrast
Comparison: None available

CLINICAL DATA: Right knee pain, stiffness and swelling for 2-3
years. MY knee preoperative planning.

EXAM:
CT OF THE right KNEE WITHOUT CONTRAST
TECHNIQUE: Multidetector CT imaging of the right knee was performed according
to the standard protocol. Multiplanar CT image reconstructions were
also generated.

[Series 7: axial st · axial · 0.39mm/px · z∈[+434,+658]mm · 7 of 300 slices shown, 9 images]
[im 38/300  soft-tissue]
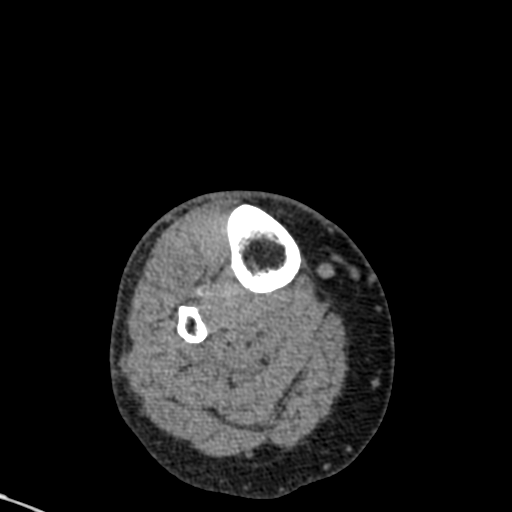
[im 38/300  bone]
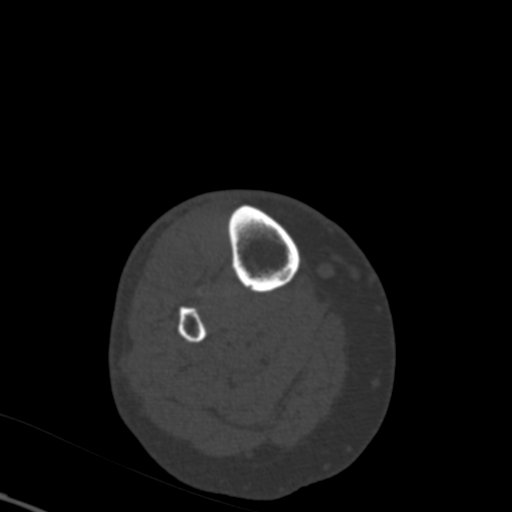
[im 75/300  bone]
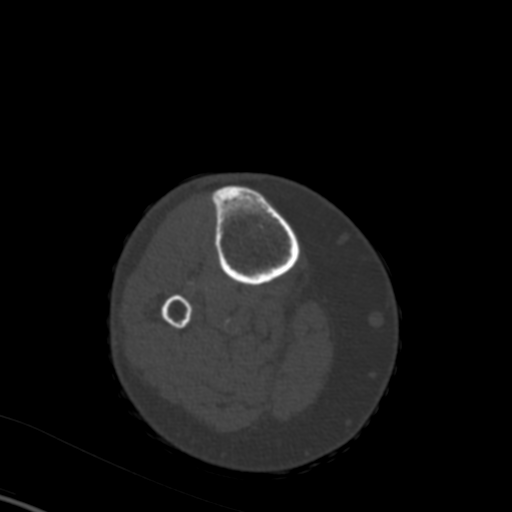
[im 113/300  bone]
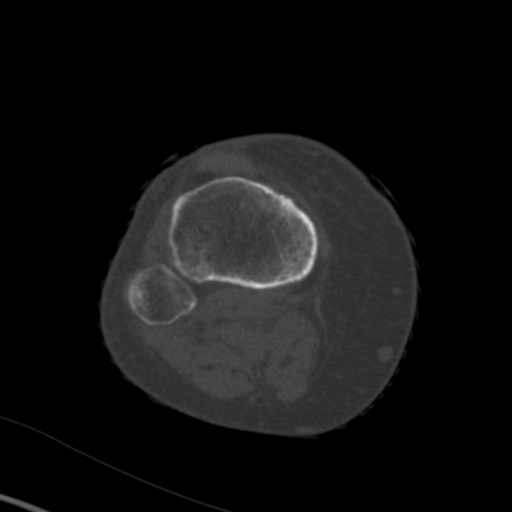
[im 150/300  bone]
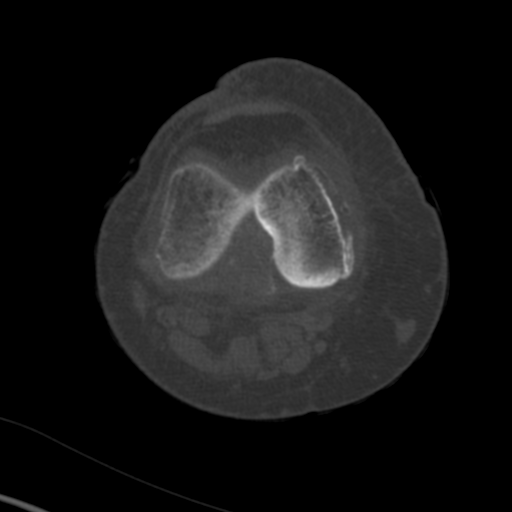
[im 187/300  soft-tissue]
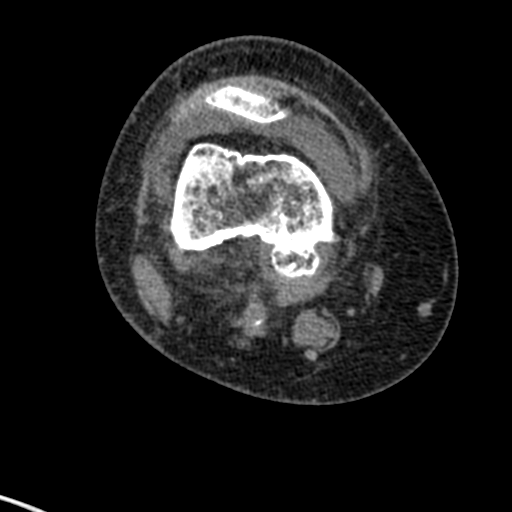
[im 187/300  bone]
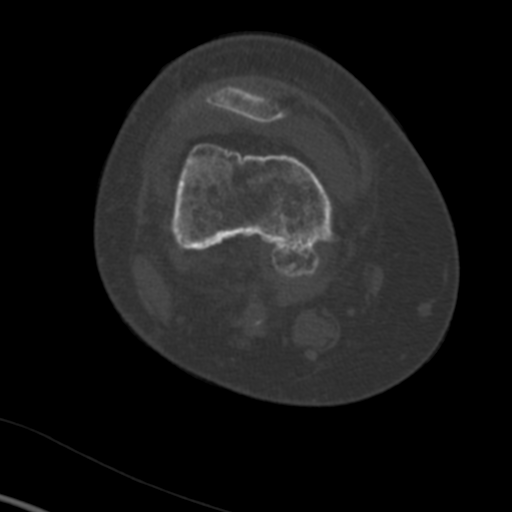
[im 225/300  bone]
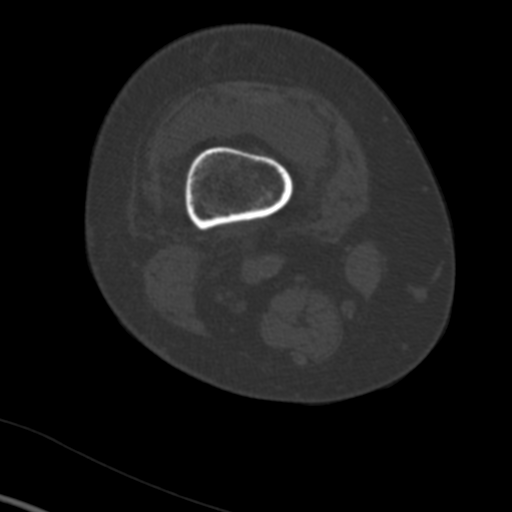
[im 262/300  bone]
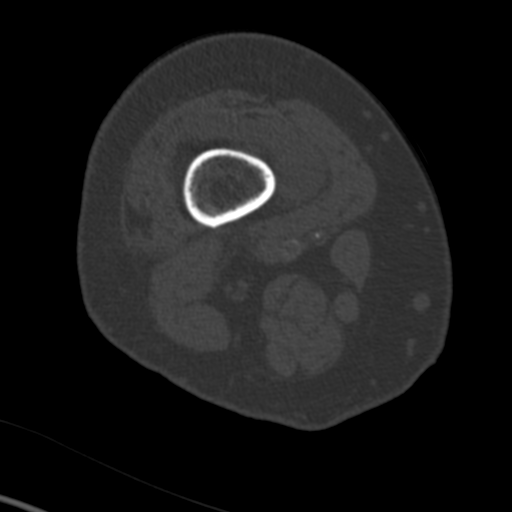

[7 of 14 positions shown; findings below may reference images not displayed]

FINDINGS: Right hip: Axial images demonstrate mild degenerative changes but no
fracture or AVN. The right SI joint appears normal.

Right knee: Advanced tricompartmental degenerative changes with
joint space narrowing, osteophytic spurring, bony eburnation and
subchondral cystic change. Chondrocalcinosis is also noted. No
fracture or osteochondral lesion. There is a moderate-sized joint
effusion.

Right ankle: Axial images do not demonstrate any significant
degenerative changes or osteochondral abnormality.
IMPRESSION: Advanced tricompartmental degenerative changes involving the right
knee as described above. This is most significant in the medial
compartment.

## 2019-06-10 DIAGNOSIS — R413 Other amnesia: Secondary | ICD-10-CM | POA: Insufficient documentation

## 2020-05-29 DIAGNOSIS — N3281 Overactive bladder: Secondary | ICD-10-CM | POA: Insufficient documentation

## 2020-07-02 ENCOUNTER — Other Ambulatory Visit (HOSPITAL_COMMUNITY): Payer: Self-pay | Admitting: Neurology

## 2020-07-02 ENCOUNTER — Other Ambulatory Visit: Payer: Self-pay | Admitting: Neurology

## 2020-07-02 DIAGNOSIS — G309 Alzheimer's disease, unspecified: Secondary | ICD-10-CM

## 2020-07-02 DIAGNOSIS — F0280 Dementia in other diseases classified elsewhere without behavioral disturbance: Secondary | ICD-10-CM

## 2020-07-20 ENCOUNTER — Ambulatory Visit
Admission: RE | Admit: 2020-07-20 | Discharge: 2020-07-20 | Disposition: A | Discharge status: Home / Self Care | Payer: Medicare PPO | Source: Ambulatory Visit | Attending: Neurology | Admitting: Neurology

## 2020-07-20 ENCOUNTER — Other Ambulatory Visit: Payer: Self-pay

## 2020-07-20 ENCOUNTER — Other Ambulatory Visit (INDEPENDENT_AMBULATORY_CARE_PROVIDER_SITE_OTHER): Payer: Self-pay | Admitting: Vascular Surgery

## 2020-07-20 DIAGNOSIS — G309 Alzheimer's disease, unspecified: Secondary | ICD-10-CM | POA: Diagnosis present

## 2020-07-20 DIAGNOSIS — F0150 Vascular dementia without behavioral disturbance: Secondary | ICD-10-CM | POA: Insufficient documentation

## 2020-07-20 DIAGNOSIS — F0280 Dementia in other diseases classified elsewhere without behavioral disturbance: Secondary | ICD-10-CM | POA: Insufficient documentation

## 2020-07-20 DIAGNOSIS — R4189 Other symptoms and signs involving cognitive functions and awareness: Secondary | ICD-10-CM

## 2020-07-20 DIAGNOSIS — I998 Other disorder of circulatory system: Secondary | ICD-10-CM

## 2020-07-20 MED ORDER — GADOBUTROL 1 MMOL/ML IV SOLN
7.0000 mL | Freq: Once | INTRAVENOUS | Status: AC | PRN
  Administered 2020-07-20: 7 mL via INTRAVENOUS

## 2020-07-27 ENCOUNTER — Ambulatory Visit (INDEPENDENT_AMBULATORY_CARE_PROVIDER_SITE_OTHER): Payer: Medicare PPO | Admitting: Vascular Surgery

## 2020-07-27 ENCOUNTER — Ambulatory Visit (INDEPENDENT_AMBULATORY_CARE_PROVIDER_SITE_OTHER): Payer: Medicare PPO

## 2020-07-27 ENCOUNTER — Other Ambulatory Visit: Payer: Self-pay

## 2020-07-27 ENCOUNTER — Encounter (INDEPENDENT_AMBULATORY_CARE_PROVIDER_SITE_OTHER): Payer: Self-pay | Admitting: Vascular Surgery

## 2020-07-27 VITALS — BP 152/89 | HR 64 | Ht 63.0 in | Wt 175.0 lb

## 2020-07-27 DIAGNOSIS — I998 Other disorder of circulatory system: Secondary | ICD-10-CM

## 2020-07-27 DIAGNOSIS — I129 Hypertensive chronic kidney disease with stage 1 through stage 4 chronic kidney disease, or unspecified chronic kidney disease: Secondary | ICD-10-CM | POA: Diagnosis not present

## 2020-07-27 DIAGNOSIS — R4189 Other symptoms and signs involving cognitive functions and awareness: Secondary | ICD-10-CM

## 2020-07-27 DIAGNOSIS — R6889 Other general symptoms and signs: Secondary | ICD-10-CM | POA: Diagnosis not present

## 2020-07-27 DIAGNOSIS — E782 Mixed hyperlipidemia: Secondary | ICD-10-CM

## 2020-07-27 DIAGNOSIS — I6523 Occlusion and stenosis of bilateral carotid arteries: Secondary | ICD-10-CM | POA: Diagnosis not present

## 2020-07-27 DIAGNOSIS — N183 Chronic kidney disease, stage 3 unspecified: Secondary | ICD-10-CM

## 2020-07-27 NOTE — Progress Notes (Signed)
MRN : [id number]  Julia Bennett is a 79 y.o. (1941/08/03) female who presents with chief complaint of my blood pressure is different in my arms.  History of Present Illness:   The patient is seen for evaluation of discrepancy of her blood pressure between the right and left arms.  Daughter notes that this has been documented on at least three occasions at different offices.  The patient denies arm claudication symptoms.  There have been no ulcerations or wounds of the fingers.  There is no history of rest pain of the hands.  The patient denies amaurosis fugax. There is no recent history of TIA symptoms or focal motor deficits. There is no prior documented CVA.  There is no history of migraine headaches. There is no history of seizures.  The patient is taking enteric-coated aspirin 81 mg daily.  No recent episodes of angina or shortness of breath. The patient denies PAD or claudication symptoms. There is a history of hyperlipidemia which is being treated with a statin.   Using the [facility] machine the systolic blood pressure of the right and left arm is only eight points different.  The Doppler signals at the level of the brachial artery is triphasic.  Duplex ultrasound of the carotid arteries demonstrates very mild atherosclerotic plaque no hemodynamically significant stenosis.  The velocities of the subclavian arteries are within the normal range bilaterally.   Past Medical History:  Diagnosis Date   Arthritis    Hypertension    Hypothyroidism     Past Surgical History:  Procedure Laterality Date   ABDOMINAL HYSTERECTOMY     05/2008- at [facility]   ANTERIOR AND POSTERIOR VAGINAL REPAIR     APPENDECTOMY     COLONOSCOPY     ROTATOR CUFF REPAIR Right    TOTAL KNEE ARTHROPLASTY Right 04/04/2017   Procedure: TOTAL KNEE ARTHROPLASTY;  Surgeon: Kennedy Bucker, MD;  Location: [facility] ORS;  Service: Orthopedics;  Laterality: Right;    Social History Social History   Tobacco Use    Smoking status: Never Smoker   Smokeless tobacco: Never Used  Building services engineer Use: Never used  Substance Use Topics   Alcohol use: No   Drug use: No    Family History Family History  Problem Relation Age of Onset   Breast cancer Neg Hx    Ovarian cancer Neg Hx    Colon cancer Neg Hx    Diabetes Neg Hx   No family history of bleeding/clotting disorders, porphyria or autoimmune disease   No Known Allergies   REVIEW OF SYSTEMS (Negative unless checked)  Constitutional: [] Weight loss  [] Fever  [] Chills Cardiac: [] Chest pain   [] Chest pressure   [] Palpitations   [] Shortness of breath when laying flat   [] Shortness of breath with exertion. Vascular:  [] Pain in legs with walking   [] Pain in legs at rest  [] History of DVT   [] Phlebitis   [] Swelling in legs   [] Varicose veins   [] Non-healing ulcers Pulmonary:   [] Uses home oxygen   [] Productive cough   [] Hemoptysis   [] Wheeze  [] COPD   [] Asthma Neurologic:  [] Dizziness   [] Seizures   [] History of stroke   [] History of TIA  [] Aphasia   [] Vissual changes   [] Weakness or numbness in arm   [] Weakness or numbness in leg Musculoskeletal:   [] Joint swelling   [] Joint pain   [] Low back pain Hematologic:  [] Easy bruising  [] Easy bleeding   [] Hypercoagulable state   [] Anemic Gastrointestinal:  []   Diarrhea   [] Vomiting  [] Gastroesophageal reflux/heartburn   [] Difficulty swallowing. Genitourinary:  [] Chronic kidney disease   [] Difficult urination  [] Frequent urination   [] Blood in urine Skin:  [] Rashes   [] Ulcers  Psychological:  [] History of anxiety   []  History of major depression.  Physical Examination  There were no vitals filed for this visit. There is no height or weight on file to calculate BMI. Gen: WD/WN, NAD Head: [place]/AT, No temporalis wasting.  Ear/Nose/Throat: Hearing grossly intact, nares w/o erythema or drainage, poor dentition Eyes: PER, EOMI, sclera nonicteric.  Neck: Supple, no masses.  No bruit or JVD.   Pulmonary:  Good air movement, clear to auscultation bilaterally, no use of accessory muscles.  Cardiac: RRR, normal S1, S2, no Murmurs. Vascular: No carotid or subclavian bruits Vessel Right Left  Radial Palpable Palpable  Brachial Palpable Palpable  Carotid Palpable Palpable  Gastrointestinal: soft, non-distended. No guarding/no peritoneal signs.  Musculoskeletal: M/S 5/5 throughout.  No deformity or atrophy.  Neurologic: CN 2-12 intact. Pain and light touch intact in extremities.  Symmetrical.  Speech is fluent. Motor exam as listed above. Psychiatric: Judgment intact, Mood & affect appropriate for pt's clinical situation. Dermatologic: No rashes or ulcers noted.  No changes consistent with cellulitis.  CBC Lab Results  Component Value Date   WBC 10.6 04/06/2017   HGB 11.7 (L) 04/06/2017   HCT 34.0 (L) 04/06/2017   MCV 86.5 04/06/2017   PLT 171 04/06/2017    BMET    Component Value Date/Time   NA 132 (L) 04/07/2017 0407   K 4.1 04/07/2017 0407   CL 93 (L) 04/07/2017 0407   CO2 32 04/07/2017 0407   GLUCOSE 111 (H) 04/07/2017 0407   BUN 13 04/07/2017 0407   CREATININE 1.03 (H) 04/07/2017 0407   CALCIUM 8.7 (L) 04/07/2017 0407   GFRNONAA 51 (L) 04/07/2017 0407   GFRAA 59 (L) 04/07/2017 0407   CrCl cannot be calculated (Patient's most recent lab result is older than the maximum 21 days allowed.).  COAG Lab Results  Component Value Date   INR 0.94 03/22/2017    Radiology MR BRAIN W WO CONTRAST  Result Date: 07/20/2020 CLINICAL DATA:  Cognitive impairment EXAM: MRI HEAD WITHOUT AND WITH CONTRAST TECHNIQUE: Multiplanar, multiecho pulse sequences of the brain and surrounding structures were obtained without and with intravenous contrast. CONTRAST:  30mL GADAVIST GADOBUTROL 1 MMOL/ML IV SOLN COMPARISON:  None. FINDINGS: Brain: No acute infarct, acute hemorrhage or extra-axial collection. Diffuse confluent hyperintense T2-weighted signal within the periventricular, deep and  juxtacortical white matter. There is generalized atrophy without lobar predilection. No chronic microhemorrhage. Normal midline structures. Vascular: Major flow voids are preserved. Skull and upper cervical spine: Normal calvarium and skull base. Visualized upper cervical spine and soft tissues are normal. Sinuses/Orbits:No paranasal sinus fluid levels or advanced mucosal thickening. No mastoid or middle ear effusion. Normal orbits. IMPRESSION: 1. No acute intracranial abnormality. 2. Advanced chronic microvascular disease and generalized atrophy without a clear lobar predilection. Quantitative, volumetric MRI of the brain may be helpful for more specific evaluation for characteristic atrophy patterns associated with dementia. Electronically Signed   By: 06/07/2017 M.D.   On: 07/20/2020 21:50     Assessment/Plan 1. Blood pressure alteration The patient does not appear to have any significant discrepancy in her blood pressures between the right and left arms.  This is based on the triphasic waveforms the proximal machine systolic reading as well as the normal velocities and lack of any evidence of subclavian  steal syndrome on review of the carotid duplex.  Clinically, she is not exhibiting any symptoms that would also be consistent with a hemodynamically significant lesion.  At this point no further invasive testing is indicated.  She will follow-up with me as needed  2. Bilateral carotid artery stenosis Recommend:  Given the patient's asymptomatic subcritical stenosis no further invasive testing or surgery at this time.  Duplex ultrasound shows <50% stenosis bilaterally which has been unchanged when compared to the previous studies.  Continue antiplatelet therapy as prescribed Continue management of CAD, HTN and Hyperlipidemia Healthy heart diet,  encouraged exercise at least 4 times per week  Given the stable <50% bilateral carotid stenosis in association with the patient's age the patient  will follow up PRN.  The patient is told that if symptoms of a TIA should occur then he should go to the ER and I should be notified, as this would change the management course.  The patient voices understanding.   3. 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  Continue hemodialysis as ordered without interruption.  Avoid nephrotoxic medications and dehydration.  Further plans per nephrology  4. Mixed hyperlipidemia Continue statin as ordered and reviewed, no changes at this time     Levora Dredge, MD  07/27/2020 11:26 AM

## 2020-07-28 ENCOUNTER — Encounter (INDEPENDENT_AMBULATORY_CARE_PROVIDER_SITE_OTHER): Payer: Self-pay | Admitting: Vascular Surgery

## 2020-07-28 DIAGNOSIS — R6889 Other general symptoms and signs: Secondary | ICD-10-CM | POA: Insufficient documentation

## 2020-07-28 DIAGNOSIS — I6529 Occlusion and stenosis of unspecified carotid artery: Secondary | ICD-10-CM | POA: Insufficient documentation

## 2020-07-29 ENCOUNTER — Ambulatory Visit: Payer: Self-pay

## 2020-08-03 ENCOUNTER — Encounter (INDEPENDENT_AMBULATORY_CARE_PROVIDER_SITE_OTHER): Payer: Self-pay | Admitting: Vascular Surgery

## 2023-02-27 DEATH — deceased
